# Patient Record
Sex: Female | Born: 1980 | Race: White | Hispanic: No | Marital: Married | Smoking: Never smoker
Health system: Southern US, Community
[De-identification: ages and names within clinical notes are randomized; demographics above are authoritative.]

## PROBLEM LIST (undated history)

## (undated) DIAGNOSIS — G459 Transient cerebral ischemic attack, unspecified: Secondary | ICD-10-CM

## (undated) DIAGNOSIS — K219 Gastro-esophageal reflux disease without esophagitis: Secondary | ICD-10-CM

## (undated) DIAGNOSIS — D473 Essential (hemorrhagic) thrombocythemia: Secondary | ICD-10-CM

## (undated) DIAGNOSIS — T7029XA Other effects of high altitude, initial encounter: Secondary | ICD-10-CM

## (undated) DIAGNOSIS — T7840XA Allergy, unspecified, initial encounter: Secondary | ICD-10-CM

## (undated) DIAGNOSIS — D45 Polycythemia vera: Secondary | ICD-10-CM

## (undated) DIAGNOSIS — T7020XA Unspecified effects of high altitude, initial encounter: Secondary | ICD-10-CM

## (undated) HISTORY — DX: Transient cerebral ischemic attack, unspecified: G45.9

## (undated) HISTORY — DX: Allergy, unspecified, initial encounter: T78.40XA

## (undated) HISTORY — DX: Essential (hemorrhagic) thrombocythemia: D47.3

## (undated) HISTORY — DX: Gastro-esophageal reflux disease without esophagitis: K21.9

## (undated) HISTORY — PX: NASAL SINUS SURGERY: SHX719

---

## 2004-10-23 ENCOUNTER — Emergency Department (HOSPITAL_COMMUNITY): Admission: EM | Admit: 2004-10-23 | Discharge: 2004-10-23 | Payer: Self-pay | Admitting: Emergency Medicine

## 2013-09-01 ENCOUNTER — Ambulatory Visit (INDEPENDENT_AMBULATORY_CARE_PROVIDER_SITE_OTHER): Payer: BC Managed Care – PPO

## 2013-09-01 ENCOUNTER — Ambulatory Visit (INDEPENDENT_AMBULATORY_CARE_PROVIDER_SITE_OTHER): Payer: BC Managed Care – PPO | Admitting: Family Medicine

## 2013-09-01 VITALS — BP 123/82 | HR 56 | Temp 98.5°F | Resp 17 | Ht 66.0 in | Wt 150.0 lb

## 2013-09-01 DIAGNOSIS — R51 Headache: Secondary | ICD-10-CM

## 2013-09-01 DIAGNOSIS — T7029XA Other effects of high altitude, initial encounter: Secondary | ICD-10-CM

## 2013-09-01 DIAGNOSIS — R0789 Other chest pain: Secondary | ICD-10-CM

## 2013-09-01 DIAGNOSIS — R110 Nausea: Secondary | ICD-10-CM

## 2013-09-01 DIAGNOSIS — R0602 Shortness of breath: Secondary | ICD-10-CM

## 2013-09-01 DIAGNOSIS — T7020XA Unspecified effects of high altitude, initial encounter: Secondary | ICD-10-CM

## 2013-09-01 LAB — POCT URINALYSIS DIPSTICK
BILIRUBIN UA: NEGATIVE
GLUCOSE UA: NEGATIVE
Ketones, UA: NEGATIVE
Leukocytes, UA: NEGATIVE
Nitrite, UA: NEGATIVE
PROTEIN UA: NEGATIVE
SPEC GRAV UA: 1.02
UROBILINOGEN UA: 0.2
pH, UA: 7

## 2013-09-01 LAB — POCT UA - MICROSCOPIC ONLY
CASTS, UR, LPF, POC: NEGATIVE
CRYSTALS, UR, HPF, POC: NEGATIVE
YEAST UA: NEGATIVE

## 2013-09-01 LAB — D-DIMER, QUANTITATIVE: D-Dimer, Quant: 1.79 ug/mL-FEU — ABNORMAL HIGH (ref 0.00–0.48)

## 2013-09-01 NOTE — Patient Instructions (Signed)
Altitude Sickness °Altitude sickness occurs when a person goes to a high altitude (at least 8,200 ft [2,460 m] above sea level) without first letting the body adjust to the higher altitude (acclimate). Symptoms generally develop within 72 hours of arriving at high altitude. It can happen to anyone, regardless of physical condition. Altitude sickness is a medical emergency that can develop into a life-threatening condition.  °CAUSES  °Altitude sickness is caused by rapidly ascending to higher altitudes that expose you to lower air pressure and lower oxygen levels. Going to high altitudes quickly and exerting yourself can make altitude sickness more likely to occur.  °SYMPTOMS  °· Severe headache. °· Nausea and vomiting. °· Shortness of breath. °· Dizziness. °· Confusion. °· Uncoordinated movements. °· Fatigue and sleep disturbances. °· Weakness. °· Hallucinations. °DIAGNOSIS  °Your caregiver will take your medical history and perform a physical exam. A chest X-ray may also be taken. °TREATMENT  °In most cases of mild altitude sickness, your symptoms will resolve gradually over 3 to 5 days. If treatment is needed, it begins with moving to a lower altitude (1,800 ft [540 m] above sea level or lower) as quickly and safely as possible. Oxygen and certain medicines may also be given to help with breathing. In severe cases, you may need to stay in the hospital. °PREVENTION  °To prevent altitude sickness during future trips: °· Go to higher altitudes slowly, giving your body time to acclimate. °· Go to higher altitudes during the daytime and return to lower altitudes at night. °· Give your body a few days to adjust to a change in altitude before starting strenuous physical activities. °· Ask your caregiver about medicines you can take to prevent altitude sickness. °HOME CARE INSTRUCTIONS  °· If you must exercise, do so lightly for the first 24 to 36 hours after treatment. °· Drink enough fluids to keep your urine clear or  pale yellow. °· Eat small, light meals. °· Avoid smoking, calming medicines (sedatives), and alcohol. °· Remain at a low altitude. °· Have someone stay with you until you feel stable. °· Keep all follow-up appointments as directed by your caregiver. °SEEK IMMEDIATE MEDICAL CARE IF: °· You have severe shortness of breath at rest or with exertion. °· You have chest pain or tightness. °· You have a fast heartbeat. °· You have a severe headache. °· You have a severe cough. °· You have difficulty walking. °· You have difficulty concentrating. °· You feel confused. °MAKE SURE YOU:  °· Understand these instructions. °· Will watch your condition. °· Will get help right away if you are not doing well or get worse. °Document Released: 02/17/2000 Document Revised: 08/21/2011 Document Reviewed: 04/20/2011 °ExitCare® Patient Information ©2015 [employer]. This information is not intended to replace advice given to you by your health care provider. Make sure you discuss any questions you have with your health care provider. ° °

## 2013-09-01 NOTE — Progress Notes (Addendum)
Subjective:    Patient ID: Shannon Mcdonald, female    DOB: February 12, 1981, 33 y.o.   MRN: [id number] This chart was scribed for Reginia Forts, MD by Vernell Barrier, Medical Scribe. The patient was seen in room 5. This patient's care was started at 6:08 PM.   09/01/2013  Chest Pain and Shortness of Breath   Chest Pain  Associated symptoms include abdominal pain, a cough, headaches and shortness of breath. Pertinent negatives include no diaphoresis, dizziness, fever, nausea, numbness or vomiting.  Shortness of Breath Associated symptoms include abdominal pain, chest pain, headaches, rhinorrhea and a sore throat. Pertinent negatives include no ear pain, fever or vomiting.   HPI Comments: Shannon Mcdonald is a 33 y.o. female who presents to the [facility] complaining of SOB, intermittent chest pressure, and dull HA. States she went on a 4 day back packing trip in the mountains in [place], [place] and returned yesterday. Flew 5 hrs to [place], drove 4 hours to [facility], 4 hours back to [place], and flew 5 hours back to [place]. States sxs began on the drive back to [place]. Reports HA and central to left sided chest tightness at that time. Some nausea and diaphoresis primarily at night. 1 episode of diarrhea 4 days ago. States she was between 7,000-10,000 ft at highest elevation during trip. Has not been at a peak that high in 7-8 years. Went to the ED 4 days ago in [place] and had a CXR and EKG completed that came back normal. CBC and CMET done that were normal. Was given a GI cocktail she states improved sxs temporarily. Reports she was drinking a lot of water to stay hydrated. Still reports SOB, intermittent chest pressure, and dull HA. Also reports some constant tightness in her throat; more pronounced with breathing. Some mild productive cough and mild rhinorrhea. Legs feel achy but mostly attributes this to the hiking. Decreased appetite since returning. States these  sxs have not occurred with hiking at high altitudes in the past.    Does not take any regular medications. No PCP. No family hx of chronic illness except mother has HTN. Does not smoke. Alcohol occasionally. Exercises regularly.   Otherwise healthy. Denies fever, chills, nausea, diaphoresis, visual disturbance, dizziness, or tinnitus.   Review of Systems  Constitutional: Positive for appetite change and fatigue. Negative for fever, chills and diaphoresis.  HENT: Positive for rhinorrhea and sore throat. Negative for congestion, ear pain, postnasal drip and tinnitus.   Eyes: Negative for photophobia and visual disturbance.  Respiratory: Positive for cough, chest tightness and shortness of breath.   Cardiovascular: Positive for chest pain.  Gastrointestinal: Positive for abdominal pain. Negative for nausea, vomiting and diarrhea.  Musculoskeletal: Positive for myalgias.  Neurological: Positive for headaches. Negative for dizziness, tremors, facial asymmetry, speech difficulty, light-headedness and numbness.  Psychiatric/Behavioral: Negative for confusion and decreased concentration.    Past Medical History  Diagnosis Date  . Allergy   . GERD (gastroesophageal reflux disease)    Past Surgical History  Procedure Laterality Date  . Nasal sinus surgery      No Known Allergies Current Outpatient Prescriptions  Medication Sig Dispense Refill  . fluticasone (FLONASE) 50 MCG/ACT nasal spray Place into both nostrils daily.      Marland Kitchen omeprazole (PRILOSEC) 10 MG capsule Take 10 mg by mouth daily.       No current facility-administered medications for this visit.   History   Social History  . Marital Status: Single  Spouse Name: N/A    Number of Children: N/A  . Years of Education: N/A   Occupational History  . Not on file.   Social History Main Topics  . Smoking status: Never Smoker   . Smokeless tobacco: Not on file  . Alcohol Use: No  . Drug Use: No  . Sexual Activity: No    Other Topics Concern  . Not on file   Social History Narrative  . No narrative on file   Family History  Problem Relation Age of Onset  . Hypertension Mother     Objective:  Triage vitals: BP 123/82  Pulse 56  Temp(Src) 98.5 F (36.9 C) (Oral)  Resp 17  Ht $R'5\' 6"'Ez$  (1.676 m)  Wt 150 lb (68.04 kg)  BMI 24.22 kg/m2  SpO2 100%  LMP 08/25/2013  Physical Exam  Nursing note and vitals reviewed. Constitutional: She is oriented to person, place, and time. She appears well-developed and well-nourished. No distress.  HENT:  Head: Normocephalic and atraumatic.  Right Ear: External ear normal.  Left Ear: External ear normal.  Nose: Nose normal.  Mouth/Throat: Oropharynx is clear and moist.  Eyes: Conjunctivae and EOM are normal. Pupils are equal, round, and reactive to light.  Neck: Normal range of motion. Neck supple. Carotid bruit is not present. No tracheal deviation present. No thyromegaly present.  Cardiovascular: Normal rate, regular rhythm, normal heart sounds and intact distal pulses.  Exam reveals no gallop and no friction rub.   No murmur heard. Pulses:      Dorsalis pedis pulses are 2+ on the right side, and 2+ on the left side.  Capillary refill > 3 seconds.  Pulmonary/Chest: Effort normal and breath sounds normal. No respiratory distress. She has no wheezes. She has no rales.  Abdominal: Soft. Bowel sounds are normal. She exhibits no distension and no mass. There is tenderness in the epigastric area and left upper quadrant. There is no rebound and no guarding.  Epigastric and LUQ  Musculoskeletal: Normal range of motion.  Lymphadenopathy:    She has no cervical adenopathy.  Neurological: She is alert and oriented to person, place, and time. No cranial nerve deficit.  Skin: Skin is warm and dry. No rash noted. She is not diaphoretic. No erythema. No pallor.  Psychiatric: She has a normal mood and affect. Her behavior is normal.   Results for orders placed in visit on  09/01/13  COMPREHENSIVE METABOLIC PANEL      Result Value Ref Range   Sodium 143  135 - 145 mEq/L   Potassium 4.5  3.5 - 5.3 mEq/L   Chloride 109  96 - 112 mEq/L   CO2 27  19 - 32 mEq/L   Glucose, Bld 81  70 - 99 mg/dL   BUN 15  6 - 23 mg/dL   Creat 0.90  0.50 - 1.10 mg/dL   Total Bilirubin 0.2  0.2 - 1.2 mg/dL   Alkaline Phosphatase 34 (*) 39 - 117 U/L   AST 17  0 - 37 U/L   ALT 20  0 - 35 U/L   Total Protein 6.3  6.0 - 8.3 g/dL   Albumin 4.2  3.5 - 5.2 g/dL   Calcium 9.3  8.4 - 10.5 mg/dL  D-DIMER, QUANTITATIVE      Result Value Ref Range   D-Dimer, Quant 1.79 (*) 0.00 - 0.48 ug/mL-FEU  CBC WITH DIFFERENTIAL      Result Value Ref Range   WBC 6.2  4.0 - 10.5  K/uL   RBC 4.13  3.87 - 5.11 MIL/uL   Hemoglobin 12.9  12.0 - 15.0 g/dL   HCT 37.7  36.0 - 46.0 %   MCV 91.3  78.0 - 100.0 fL   MCH 31.2  26.0 - 34.0 pg   MCHC 34.2  30.0 - 36.0 g/dL   RDW 13.4  11.5 - 15.5 %   Platelets 520 (*) 150 - 400 K/uL   Neutrophils Relative % 62  43 - 77 %   Neutro Abs 3.8  1.7 - 7.7 K/uL   Lymphocytes Relative 30  12 - 46 %   Lymphs Abs 1.9  0.7 - 4.0 K/uL   Monocytes Relative 5  3 - 12 %   Monocytes Absolute 0.3  0.1 - 1.0 K/uL   Eosinophils Relative 2  0 - 5 %   Eosinophils Absolute 0.1  0.0 - 0.7 K/uL   Basophils Relative 1  0 - 1 %   Basophils Absolute 0.1  0.0 - 0.1 K/uL   Smear Review Criteria for review not met    POCT UA - MICROSCOPIC ONLY      Result Value Ref Range   WBC, Ur, HPF, POC 4-6     RBC, urine, microscopic 0-2     Bacteria, U Microscopic 2+     Mucus, UA 1+     Epithelial cells, urine per micros 3-6     Crystals, Ur, HPF, POC neg     Casts, Ur, LPF, POC neg     Yeast, UA neg    POCT URINALYSIS DIPSTICK      Result Value Ref Range   Color, UA yellow     Clarity, UA hazy     Glucose, UA neg     Bilirubin, UA neg     Ketones, UA neg     Spec Grav, UA 1.020     Blood, UA small     pH, UA 7.0     Protein, UA neg     Urobilinogen, UA 0.2     Nitrite, UA neg      Leukocytes, UA Negative        [facility] reading (PRIMARY) by  Dr. Tamala Julian.  CXR:  NAD   Assessment & Plan:  Shortness of breath - Plan: POCT UA - Microscopic Only, POCT urinalysis dipstick, Comprehensive metabolic panel, D-dimer, quantitative, DG Chest 2 View, CBC with Differential, CT Angio Chest W/Cm &/Or Wo Cm, CANCELED: POCT CBC  Other chest pain - Plan: POCT UA - Microscopic Only, POCT urinalysis dipstick, Comprehensive metabolic panel, D-dimer, quantitative, DG Chest 2 View, CBC with Differential, CT Angio Chest W/Cm &/Or Wo Cm, CANCELED: POCT CBC  Altitude sickness, initial encounter  Nausea alone  Headache(784.0)   1.  Shortness of breath: New.  Associated with headache, chest tightness, nausea. Consistent with altitude sickness; however, recent prolonged travel thus will obtain D-Dimer which is positive; refer for CT chest angio to rule out PE.  Benign exam in office; no respiratory distress.   2.  Chest pain: New. Associated with SOB.  EKG in [place] negative; obtain CBC, CMET which are both normal.  No suggestion of cardiac etiology. 3.  Nausea: New. Recommend BRAT diet, hydration.  Recommend taking Prilosec daily for next two weeks. 4. HA: New. Normal neurological exam in office. Recommend Tylenol or Ibuprofen PRN. 5.  Altitude Sickness: New.  Neurological intact; normal CXR and lung exam.  CXR without pulmonary edema.  Supportive care with rest, Tylenol, hydration.  Has returned to normal altitude so symptoms should slowly improve.  Meds ordered this encounter  Medications  . fluticasone (FLONASE) 50 MCG/ACT nasal spray    Sig: Place into both nostrils daily.  Marland Kitchen omeprazole (PRILOSEC) 10 MG capsule    Sig: Take 10 mg by mouth daily.    No Follow-up on file.   I personally performed the services described in this documentation, which was scribed in my presence.  The recorded information has been reviewed and is accurate.  Reginia Forts, M.D.  [facility] [address] [place], [place]  [id number] [phone] phone [phone] fax

## 2013-09-02 LAB — COMPREHENSIVE METABOLIC PANEL
ALBUMIN: 4.2 g/dL (ref 3.5–5.2)
ALK PHOS: 34 U/L — AB (ref 39–117)
ALT: 20 U/L (ref 0–35)
AST: 17 U/L (ref 0–37)
BILIRUBIN TOTAL: 0.2 mg/dL (ref 0.2–1.2)
BUN: 15 mg/dL (ref 6–23)
CHLORIDE: 109 meq/L (ref 96–112)
CO2: 27 mEq/L (ref 19–32)
Calcium: 9.3 mg/dL (ref 8.4–10.5)
Creat: 0.9 mg/dL (ref 0.50–1.10)
Glucose, Bld: 81 mg/dL (ref 70–99)
Potassium: 4.5 mEq/L (ref 3.5–5.3)
SODIUM: 143 meq/L (ref 135–145)
Total Protein: 6.3 g/dL (ref 6.0–8.3)

## 2013-09-02 LAB — CBC WITH DIFFERENTIAL/PLATELET
BASOS ABS: 0.1 10*3/uL (ref 0.0–0.1)
Basophils Relative: 1 % (ref 0–1)
EOS ABS: 0.1 10*3/uL (ref 0.0–0.7)
EOS PCT: 2 % (ref 0–5)
HEMATOCRIT: 37.7 % (ref 36.0–46.0)
HEMOGLOBIN: 12.9 g/dL (ref 12.0–15.0)
LYMPHS ABS: 1.9 10*3/uL (ref 0.7–4.0)
Lymphocytes Relative: 30 % (ref 12–46)
MCH: 31.2 pg (ref 26.0–34.0)
MCHC: 34.2 g/dL (ref 30.0–36.0)
MCV: 91.3 fL (ref 78.0–100.0)
MONO ABS: 0.3 10*3/uL (ref 0.1–1.0)
MONOS PCT: 5 % (ref 3–12)
NEUTROS PCT: 62 % (ref 43–77)
Neutro Abs: 3.8 10*3/uL (ref 1.7–7.7)
Platelets: 520 10*3/uL — ABNORMAL HIGH (ref 150–400)
RBC: 4.13 MIL/uL (ref 3.87–5.11)
RDW: 13.4 % (ref 11.5–15.5)
WBC: 6.2 10*3/uL (ref 4.0–10.5)

## 2013-09-03 ENCOUNTER — Ambulatory Visit
Admission: RE | Admit: 2013-09-03 | Discharge: 2013-09-03 | Disposition: A | Discharge status: Home / Self Care | Payer: BC Managed Care – PPO | Source: Ambulatory Visit | Attending: Family Medicine | Admitting: Family Medicine

## 2013-09-03 ENCOUNTER — Telehealth: Payer: Self-pay

## 2013-09-03 ENCOUNTER — Telehealth: Payer: Self-pay | Admitting: *Deleted

## 2013-09-03 ENCOUNTER — Telehealth: Payer: Self-pay | Admitting: Radiology

## 2013-09-03 DIAGNOSIS — R0602 Shortness of breath: Secondary | ICD-10-CM

## 2013-09-03 DIAGNOSIS — R0789 Other chest pain: Secondary | ICD-10-CM

## 2013-09-03 MED ORDER — IOHEXOL 350 MG/ML SOLN
125.0000 mL | Freq: Once | INTRAVENOUS | Status: AC | PRN
  Administered 2013-09-03: 125 mL via INTRAVENOUS

## 2013-09-03 NOTE — Telephone Encounter (Signed)
Set up appointment at [place] for CT chest angiogram at 11:30am today Left message on machine for patient with details.  Instructed her to call back to let us know she got my message with her appointment date and time.  (Call report to [phone]; Prior auth number [id number]. [facility] aware of prior auth)

## 2013-09-03 NOTE — Telephone Encounter (Signed)
Patient called regarding her results. States that Clarise Cruz mentioned she would call her back today but she has not heard anything yet.

## 2013-09-03 NOTE — Telephone Encounter (Signed)
Imaging results are in- trace bilateral effusions noted in scan. Please advise.

## 2013-09-03 NOTE — Telephone Encounter (Signed)
PATIENT IS RETURNING A MISSED PHONE CALL FROM TODAY. PATIENT ISN'T SURE WHAT PHONE CALL WAS FOR. PLEASE CALL PATIENT! [phone]

## 2013-09-03 NOTE — Telephone Encounter (Signed)
Dr Tamala Julian pt is concerned about some lingering symptoms. Pt reports that she has had a dull headache- takes tylenol but does not completely go away. She has been having leg pains that radiate down bilateral legs. She notices that the pain is worse when she is sitting down.

## 2013-09-03 NOTE — Telephone Encounter (Signed)
Call -- no evidence of blood clot in lungs. Lm for pt on VM with results to rtn call if any further questions.

## 2013-09-03 NOTE — Telephone Encounter (Signed)
Patient called back, received details of appointment at [facility] and will be able to go today at scheduled time

## 2013-09-03 NOTE — Telephone Encounter (Signed)
Spoke to patient --- no further work up at this time recommended.  Legs are aching and headache is not resolved; recommend supportive care with rest, fluids, Tylenol or Ibuprofen.  Should follow-up for worsening symptoms.  SOB has improved each day.

## 2013-09-04 NOTE — Telephone Encounter (Signed)
Pt was advised of CT results.

## 2013-09-10 ENCOUNTER — Ambulatory Visit (INDEPENDENT_AMBULATORY_CARE_PROVIDER_SITE_OTHER): Payer: BC Managed Care – PPO | Admitting: Family Medicine

## 2013-09-10 VITALS — BP 118/80 | HR 65 | Temp 97.8°F | Resp 16 | Ht 65.5 in | Wt 146.0 lb

## 2013-09-10 DIAGNOSIS — T7020XD Unspecified effects of high altitude, subsequent encounter: Secondary | ICD-10-CM

## 2013-09-10 DIAGNOSIS — R0789 Other chest pain: Secondary | ICD-10-CM

## 2013-09-10 DIAGNOSIS — K219 Gastro-esophageal reflux disease without esophagitis: Secondary | ICD-10-CM

## 2013-09-10 DIAGNOSIS — R202 Paresthesia of skin: Secondary | ICD-10-CM

## 2013-09-10 DIAGNOSIS — IMO0001 Reserved for inherently not codable concepts without codable children: Secondary | ICD-10-CM

## 2013-09-10 DIAGNOSIS — Z5189 Encounter for other specified aftercare: Secondary | ICD-10-CM

## 2013-09-10 DIAGNOSIS — M791 Myalgia, unspecified site: Secondary | ICD-10-CM

## 2013-09-10 DIAGNOSIS — D75839 Thrombocytosis, unspecified: Secondary | ICD-10-CM

## 2013-09-10 DIAGNOSIS — D473 Essential (hemorrhagic) thrombocythemia: Secondary | ICD-10-CM

## 2013-09-10 DIAGNOSIS — R209 Unspecified disturbances of skin sensation: Secondary | ICD-10-CM

## 2013-09-10 DIAGNOSIS — T7020XA Unspecified effects of high altitude, initial encounter: Secondary | ICD-10-CM

## 2013-09-10 DIAGNOSIS — T7029XD Other effects of high altitude, subsequent encounter: Secondary | ICD-10-CM

## 2013-09-10 NOTE — Progress Notes (Signed)
Subjective:    Patient ID: Shannon Mcdonald, female    DOB: 08-07-1980, 33 y.o.   MRN: [id number]  09/10/2013  Leg Pain   HPI This 33 y.o. female presents for one week follow-up and evaluation of the following:  1. Chest pain: interimttent for past day.  Consistent with heartburn/reflux.  Has been taking PPI daily since last visit. Drinks 4 cups of coffee per day.  Reflux is often triggered by exercise; has noticed this during exercise. No associated SOB, diaphoresis, fatigue, radiation with chest pain.  No major stressors.  SOB improved after last visit.    2.  Leg pain with tingling: worse with sitting.  Yesterday sat with legs up; now with legs flexed with onset of aching and tingling.  Forcing self to walk around.  Aching in calves B; +posterior calf and posterior knee.  Tingling is all over.  Drinking tons of water.  Eight glasses per day.  Started trying to walk 2 miles per day last week; started walking four miles per day this week.  Traveling tomorrow; traveling to [employer] and going to [facility].     3. Elevated platelet count: has been up for the past six months; no further evaluation for elevated platelets.  Just wants to make sure that it is OK for patient to travel with current symptoms.  4. Headaches: now resolved.    5.  Altitude sickness; gradually improved after last visit; no further suffering with headache, dizziness, SOB.     Review of Systems  Constitutional: Negative for fever, chills, diaphoresis and fatigue.  HENT: Negative for congestion, ear pain, sore throat and trouble swallowing.   Eyes: Negative for photophobia and visual disturbance.  Respiratory: Negative for cough, shortness of breath, wheezing and stridor.   Cardiovascular: Positive for chest pain. Negative for palpitations and leg swelling.  Gastrointestinal: Negative for nausea, vomiting, abdominal pain, diarrhea, constipation and abdominal distention.  Endocrine: Negative for cold intolerance, heat  intolerance, polydipsia, polyphagia and polyuria.  Genitourinary: Negative for dysuria and urgency.  Musculoskeletal: Positive for myalgias. Negative for arthralgias and back pain.  Skin: Negative for rash.  Neurological: Positive for numbness. Negative for dizziness, tremors, facial asymmetry, weakness, light-headedness and headaches.  Psychiatric/Behavioral: Negative for sleep disturbance. The patient is not nervous/anxious.     Past Medical History  Diagnosis Date  . Allergy   . GERD (gastroesophageal reflux disease)    Past Surgical History  Procedure Laterality Date  . Nasal sinus surgery      No Known Allergies Current Outpatient Prescriptions  Medication Sig Dispense Refill  . fluticasone (FLONASE) 50 MCG/ACT nasal spray Place into both nostrils daily.      Marland Kitchen omeprazole (PRILOSEC) 10 MG capsule Take 10 mg by mouth daily.       No current facility-administered medications for this visit.   History   Social History  . Marital Status: Single    Spouse Name: N/A    Number of Children: N/A  . Years of Education: N/A   Occupational History  . Not on file.   Social History Main Topics  . Smoking status: Never Smoker   . Smokeless tobacco: Not on file  . Alcohol Use: No  . Drug Use: No  . Sexual Activity: No   Other Topics Concern  . Not on file   Social History Narrative  . No narrative on file   Family History  Problem Relation Age of Onset  . Hypertension Mother  Objective:    BP 118/80  Pulse 65  Temp(Src) 97.8 F (36.6 C) (Oral)  Resp 16  Ht 5' 5.5" (1.664 m)  Wt 146 lb (66.225 kg)  BMI 23.92 kg/m2  SpO2 99%  LMP 08/25/2013 Physical Exam  Constitutional: She is oriented to person, place, and time. She appears well-developed and well-nourished. No distress.  HENT:  Head: Normocephalic and atraumatic.  Right Ear: External ear normal.  Left Ear: External ear normal.  Nose: Nose normal.  Mouth/Throat: Oropharynx is clear and moist.    Eyes: Conjunctivae and EOM are normal. Pupils are equal, round, and reactive to light.  Neck: Normal range of motion. Neck supple. Carotid bruit is not present. No thyromegaly present.  Cardiovascular: Normal rate, regular rhythm, normal heart sounds and intact distal pulses.  Exam reveals no gallop and no friction rub.   No murmur heard. Pulmonary/Chest: Effort normal and breath sounds normal. She has no wheezes. She has no rales. She exhibits no tenderness.  Abdominal: Soft. Bowel sounds are normal. She exhibits no distension and no mass. There is no tenderness. There is no rebound and no guarding.  Musculoskeletal:       Right shoulder: Normal.       Left shoulder: Normal.       Right knee: She exhibits normal range of motion, no swelling, no effusion and no bony tenderness. No tenderness found. No medial joint line and no lateral joint line tenderness noted.       Left knee: She exhibits normal range of motion, no swelling, no effusion and no bony tenderness. No tenderness found. No medial joint line and no lateral joint line tenderness noted.       Right ankle: Normal.       Left ankle: Normal.       Cervical back: Normal. She exhibits normal range of motion, no tenderness, no bony tenderness, no pain and no spasm.       Thoracic back: She exhibits normal range of motion, no tenderness and no bony tenderness.       Lumbar back: She exhibits normal range of motion, no tenderness, no bony tenderness, no pain and no spasm.       Right upper leg: She exhibits no tenderness, no bony tenderness, no swelling and no edema.       Left upper leg: Normal. She exhibits no tenderness, no bony tenderness and no swelling.       Right lower leg: She exhibits no tenderness, no bony tenderness, no swelling and no edema.       Left lower leg: She exhibits no tenderness, no bony tenderness, no swelling and no edema.  Lymphadenopathy:    She has no cervical adenopathy.  Neurological: She is alert and  oriented to person, place, and time. No cranial nerve deficit.  Skin: Skin is warm and dry. No rash noted. She is not diaphoretic. No erythema. No pallor.  Psychiatric: She has a normal mood and affect. Her behavior is normal.   Results for orders placed in visit on 09/10/13  CK      Result Value Ref Range   Total CK 45  7 - 177 U/L  CBC      Result Value Ref Range   WBC 5.1  4.0 - 10.5 K/uL   RBC 4.76  3.87 - 5.11 MIL/uL   Hemoglobin 14.9  12.0 - 15.0 g/dL   HCT 42.9  36.0 - 46.0 %   MCV 90.1  78.0 - 100.0  fL   MCH 31.3  26.0 - 34.0 pg   MCHC 34.7  30.0 - 36.0 g/dL   RDW 13.3  11.5 - 15.5 %   Platelets 556 (*) 150 - 400 K/uL  COMPLETE METABOLIC PANEL WITH GFR      Result Value Ref Range   Sodium 140  135 - 145 mEq/L   Potassium 4.4  3.5 - 5.3 mEq/L   Chloride 105  96 - 112 mEq/L   CO2 29  19 - 32 mEq/L   Glucose, Bld 78  70 - 99 mg/dL   BUN 15  6 - 23 mg/dL   Creat 0.98  0.50 - 1.10 mg/dL   Total Bilirubin 0.7  0.2 - 1.2 mg/dL   Alkaline Phosphatase 41  39 - 117 U/L   AST 14  0 - 37 U/L   ALT 16  0 - 35 U/L   Total Protein 7.3  6.0 - 8.3 g/dL   Albumin 4.7  3.5 - 5.2 g/dL   Calcium 9.9  8.4 - 10.5 mg/dL   GFR, Est African American 88     GFR, Est Non African American 77    TSH      Result Value Ref Range   TSH 1.638  0.350 - 4.500 uIU/mL  VITAMIN B12      Result Value Ref Range   Vitamin B-12 332  211 - 911 pg/mL  IRON      Result Value Ref Range   Iron 121  42 - 145 ug/dL  FOLATE      Result Value Ref Range   Folate 11.2         Assessment & Plan:  Paresthesia - Plan: CBC, COMPLETE METABOLIC PANEL WITH GFR, TSH, Vitamin B12, Iron, Folate  Myalgia - Plan: CBC, COMPLETE METABOLIC PANEL WITH GFR, TSH, Vitamin B12, Iron, Folate  Other chest pain - Plan: CK  Gastroesophageal reflux disease without esophagitis - Plan: CBC, COMPLETE METABOLIC PANEL WITH GFR  Thrombocytosis  Altitude sickness, subsequent encounter  1.  Paresthesias:  New.  B legs; benign  exam and no associated weakness or back pain.  Benign exam. Normal TSH, B12, iron, and folate levels.  Continue to monitor. RTC immediately for associated weakness, lower back pain, or worsening. If worsens or persists, refer to neurology for NCS and EMG. 2.  Myalgias: New.  Associated with paresthesias of BLE.  Benign exam. Normal CK.  Monitor; recommend ongoing stretching and hydration.  If persists or worsens, RTC. 3.  GERD: worsening; dietary modification recommended and reviewed; continue with daily PPI with upcoming travel. 4.  Chest pain:  New; associated with worsening GERD symptoms; continue with daily PPI.   5.  Thrombocytosis: persistent; recommend repeat in 1-3 months. If persists, may warrant hematology consultation. 6.  Altitude sickness: improving.  No orders of the defined types were placed in this encounter.    No Follow-up on file.    Reginia Forts, M.D.  [facility] [address] [place], [place]  [address] [phone] phone [phone] fax

## 2013-09-11 LAB — COMPLETE METABOLIC PANEL WITH GFR
ALBUMIN: 4.7 g/dL (ref 3.5–5.2)
ALT: 16 U/L (ref 0–35)
AST: 14 U/L (ref 0–37)
Alkaline Phosphatase: 41 U/L (ref 39–117)
BUN: 15 mg/dL (ref 6–23)
CHLORIDE: 105 meq/L (ref 96–112)
CO2: 29 meq/L (ref 19–32)
Calcium: 9.9 mg/dL (ref 8.4–10.5)
Creat: 0.98 mg/dL (ref 0.50–1.10)
GFR, EST AFRICAN AMERICAN: 88 mL/min
GFR, EST NON AFRICAN AMERICAN: 77 mL/min
GLUCOSE: 78 mg/dL (ref 70–99)
Potassium: 4.4 mEq/L (ref 3.5–5.3)
Sodium: 140 mEq/L (ref 135–145)
TOTAL PROTEIN: 7.3 g/dL (ref 6.0–8.3)
Total Bilirubin: 0.7 mg/dL (ref 0.2–1.2)

## 2013-09-11 LAB — CBC
HCT: 42.9 % (ref 36.0–46.0)
Hemoglobin: 14.9 g/dL (ref 12.0–15.0)
MCH: 31.3 pg (ref 26.0–34.0)
MCHC: 34.7 g/dL (ref 30.0–36.0)
MCV: 90.1 fL (ref 78.0–100.0)
PLATELETS: 556 10*3/uL — AB (ref 150–400)
RBC: 4.76 MIL/uL (ref 3.87–5.11)
RDW: 13.3 % (ref 11.5–15.5)
WBC: 5.1 10*3/uL (ref 4.0–10.5)

## 2013-09-11 LAB — VITAMIN B12: Vitamin B-12: 332 pg/mL (ref 211–911)

## 2013-09-11 LAB — IRON: Iron: 121 ug/dL (ref 42–145)

## 2013-09-11 LAB — FOLATE: FOLATE: 11.2 ng/mL

## 2013-09-11 LAB — TSH: TSH: 1.638 u[IU]/mL (ref 0.350–4.500)

## 2013-09-11 LAB — CK: CK TOTAL: 45 U/L (ref 7–177)

## 2013-09-16 ENCOUNTER — Encounter: Payer: Self-pay | Admitting: Family Medicine

## 2013-09-16 DIAGNOSIS — D473 Essential (hemorrhagic) thrombocythemia: Secondary | ICD-10-CM

## 2013-09-16 DIAGNOSIS — D75839 Thrombocytosis, unspecified: Secondary | ICD-10-CM

## 2013-09-27 ENCOUNTER — Emergency Department (HOSPITAL_COMMUNITY)
Admission: EM | Admit: 2013-09-27 | Discharge: 2013-09-28 | Disposition: A | Discharge status: Home / Self Care | Payer: BC Managed Care – PPO | Attending: Emergency Medicine | Admitting: Emergency Medicine

## 2013-09-27 ENCOUNTER — Encounter (HOSPITAL_COMMUNITY): Payer: Self-pay | Admitting: Emergency Medicine

## 2013-09-27 DIAGNOSIS — R200 Anesthesia of skin: Secondary | ICD-10-CM

## 2013-09-27 DIAGNOSIS — Z3202 Encounter for pregnancy test, result negative: Secondary | ICD-10-CM | POA: Insufficient documentation

## 2013-09-27 DIAGNOSIS — Z7982 Long term (current) use of aspirin: Secondary | ICD-10-CM | POA: Insufficient documentation

## 2013-09-27 DIAGNOSIS — R202 Paresthesia of skin: Secondary | ICD-10-CM

## 2013-09-27 DIAGNOSIS — K219 Gastro-esophageal reflux disease without esophagitis: Secondary | ICD-10-CM | POA: Insufficient documentation

## 2013-09-27 DIAGNOSIS — R209 Unspecified disturbances of skin sensation: Secondary | ICD-10-CM | POA: Insufficient documentation

## 2013-09-27 DIAGNOSIS — R51 Headache: Secondary | ICD-10-CM | POA: Insufficient documentation

## 2013-09-27 DIAGNOSIS — R002 Palpitations: Secondary | ICD-10-CM | POA: Insufficient documentation

## 2013-09-27 HISTORY — DX: Other effects of high altitude, initial encounter: T70.29XA

## 2013-09-27 HISTORY — DX: Unspecified effects of high altitude, initial encounter: T70.20XA

## 2013-09-27 NOTE — ED Provider Notes (Signed)
CSN: [id number]     Arrival date & time 09/27/13  2109 History   First MD Initiated Contact with Patient 09/27/13 2313     Chief Complaint  Patient presents with  . Numbness     (Consider location/radiation/quality/duration/timing/severity/associated sxs/prior Treatment) HPI Patient has had greater than one day of a right-sided frontal headache. It was gradual onset. She states that this evening at about 8:00 she began having palpitations and feeling tremulous. She complains of numbness to her right bicep area. She had a relative die recently the last couple days. She denies any lower extremity swelling or pain. Patient states that the numbness to her right arm is now improved. She continues to have a mild right frontal headache. She had no vision changes. Denies any chest pain at any point. She was recently seen in [place] for similar event associated with chest pain. She states she had a CT of the chest rule out PE which was normal. Past Medical History  Diagnosis Date  . Allergy   . GERD (gastroesophageal reflux disease)   . Altitude sickness    Past Surgical History  Procedure Laterality Date  . Nasal sinus surgery     Family History  Problem Relation Age of Onset  . Hypertension Mother    History  Substance Use Topics  . Smoking status: Never Smoker   . Smokeless tobacco: Not on file  . Alcohol Use: No   OB History   Grav Para Term Preterm Abortions TAB SAB Ect Mult Living                 Review of Systems  Constitutional: Negative for fever and chills.  Eyes: Negative for visual disturbance.  Respiratory: Negative for shortness of breath.   Cardiovascular: Positive for palpitations. Negative for chest pain and leg swelling.  Gastrointestinal: Negative for nausea, vomiting and abdominal pain.  Musculoskeletal: Negative for back pain, myalgias, neck pain and neck stiffness.  Skin: Negative for rash and wound.  Neurological: Positive for numbness and headaches.  Negative for dizziness, syncope, weakness and light-headedness.  All other systems reviewed and are negative.     Allergies  Review of patient's allergies indicates no known allergies.  Home Medications   Prior to Admission medications   Medication Sig Start Date End Date Taking? Authorizing Provider  aspirin EC 81 MG tablet Take 81 mg by mouth daily.   Yes Historical Provider, MD  fluticasone (FLONASE) 50 MCG/ACT nasal spray Place 1 spray into both nostrils daily as needed for allergies.    Yes Historical Provider, MD  omeprazole (PRILOSEC) 10 MG capsule Take 10 mg by mouth daily as needed. For acid reflux   Yes Historical Provider, MD   BP 130/84  Pulse 93  Temp(Src) 98.7 F (37.1 C) (Oral)  Resp 18  Ht 5\' 5"  (1.651 m)  Wt 142 lb (64.411 kg)  BMI 23.63 kg/m2  SpO2 99%  LMP 09/26/2013 Physical Exam  Nursing note and vitals reviewed. Constitutional: She is oriented to person, place, and time. She appears well-developed and well-nourished. No distress.  HENT:  Head: Normocephalic and atraumatic.  Mouth/Throat: Oropharynx is clear and moist.  No sinus tenderness to percussion.  Eyes: EOM are normal. Pupils are equal, round, and reactive to light.  Neck: Normal range of motion. Neck supple.  No meningismus  Cardiovascular: Normal rate and regular rhythm.   Pulmonary/Chest: Effort normal and breath sounds normal. No respiratory distress. She has no wheezes. She has no rales. She exhibits no  tenderness.  Abdominal: Soft. Bowel sounds are normal. She exhibits no distension and no mass. There is no tenderness. There is no rebound and no guarding.  Musculoskeletal: Normal range of motion. She exhibits no edema and no tenderness.  No calf swelling or tenderness.  Neurological: She is alert and oriented to person, place, and time.  Patient is alert and oriented x3 with clear, goal oriented speech. Patient has 5/5 motor in all extremities. Sensation is intact to light touch. Bilateral  finger-to-nose is normal with no signs of dysmetria. Patient has a normal gait and walks without assistance.   Skin: Skin is warm and dry. No rash noted. No erythema.  Psychiatric: She has a normal mood and affect. Her behavior is normal.    ED Course  Procedures (including critical care time) Labs Review Labs Reviewed  CBC WITH DIFFERENTIAL  COMPREHENSIVE METABOLIC PANEL  URINALYSIS, ROUTINE W REFLEX MICROSCOPIC  PREGNANCY, URINE    Imaging Review No results found.   EKG Interpretation   Date/Time:  Sunday September 27 2013 21:23:47 EDT Ventricular Rate:  98 PR Interval:  135 QRS Duration: 102 QT Interval:  346 QTC Calculation: 442 R Axis:   57 Text Interpretation:  Sinus rhythm Consider right atrial enlargement  Confirmed by Lita Mains  MD, Deeana Atwater ([id number]) on 09/28/2013 3:19:19 AM      MDM   Final diagnoses:  None    Patient remains neurologically stable in emergency department. Workup was negative. Will discharge home to followup with her primary doctor. Low suspicion for TIA/CVA. She's been advised to return immediately to the emergency department for any concerns.    Julianne Rice, MD 09/28/13 [phone]

## 2013-09-27 NOTE — ED Notes (Signed)
Per pt report: pt was a dinner with a headache and during dinner, pt reports a shooting numbness feeling down the right side of her body.  Pt also reports she felt like her heart was racing.  Pt reports her right arm numbness has improved but has not completely gone away.  Pt still has a headache but the heart racing sensation seems to have improved.  Pt a/o x 4. Pt ambulatory in triage. NAD noted. Skin warm and dry.

## 2013-09-28 ENCOUNTER — Emergency Department (HOSPITAL_COMMUNITY): Payer: BC Managed Care – PPO

## 2013-09-28 LAB — URINE MICROSCOPIC-ADD ON

## 2013-09-28 LAB — PREGNANCY, URINE: Preg Test, Ur: NEGATIVE

## 2013-09-28 LAB — COMPREHENSIVE METABOLIC PANEL
ALBUMIN: 4.3 g/dL (ref 3.5–5.2)
ALT: 10 U/L (ref 0–35)
AST: 16 U/L (ref 0–37)
Alkaline Phosphatase: 42 U/L (ref 39–117)
Anion gap: 13 (ref 5–15)
BILIRUBIN TOTAL: 0.4 mg/dL (ref 0.3–1.2)
BUN: 11 mg/dL (ref 6–23)
CALCIUM: 9.1 mg/dL (ref 8.4–10.5)
CO2: 27 mEq/L (ref 19–32)
CREATININE: 0.9 mg/dL (ref 0.50–1.10)
Chloride: 103 mEq/L (ref 96–112)
GFR, EST NON AFRICAN AMERICAN: 84 mL/min — AB (ref >90)
GLUCOSE: 92 mg/dL (ref 70–99)
Potassium: 4.1 mEq/L (ref 3.7–5.3)
SODIUM: 143 meq/L (ref 137–147)
TOTAL PROTEIN: 7.2 g/dL (ref 6.0–8.3)

## 2013-09-28 LAB — URINALYSIS, ROUTINE W REFLEX MICROSCOPIC
BILIRUBIN URINE: NEGATIVE
Glucose, UA: NEGATIVE mg/dL
KETONES UR: NEGATIVE mg/dL
Leukocytes, UA: NEGATIVE
NITRITE: NEGATIVE
Protein, ur: NEGATIVE mg/dL
SPECIFIC GRAVITY, URINE: 1.009 (ref 1.005–1.030)
Urobilinogen, UA: 0.2 mg/dL (ref 0.0–1.0)
pH: 6.5 (ref 5.0–8.0)

## 2013-09-28 LAB — CBC WITH DIFFERENTIAL/PLATELET
BASOS ABS: 0 10*3/uL (ref 0.0–0.1)
BASOS PCT: 0 % (ref 0–1)
Eosinophils Absolute: 0.1 10*3/uL (ref 0.0–0.7)
Eosinophils Relative: 1 % (ref 0–5)
HEMATOCRIT: 40.6 % (ref 36.0–46.0)
Hemoglobin: 13.8 g/dL (ref 12.0–15.0)
Lymphocytes Relative: 26 % (ref 12–46)
Lymphs Abs: 2.1 10*3/uL (ref 0.7–4.0)
MCH: 31.7 pg (ref 26.0–34.0)
MCHC: 34 g/dL (ref 30.0–36.0)
MCV: 93.3 fL (ref 78.0–100.0)
MONO ABS: 0.4 10*3/uL (ref 0.1–1.0)
MONOS PCT: 5 % (ref 3–12)
Neutro Abs: 5.4 10*3/uL (ref 1.7–7.7)
Neutrophils Relative %: 68 % (ref 43–77)
PLATELETS: 530 10*3/uL — AB (ref 150–400)
RBC: 4.35 MIL/uL (ref 3.87–5.11)
RDW: 12.5 % (ref 11.5–15.5)
WBC: 8 10*3/uL (ref 4.0–10.5)

## 2013-09-28 NOTE — Discharge Instructions (Signed)
Paresthesia Paresthesia is an abnormal burning or prickling sensation. This sensation is generally felt in the hands, arms, legs, or feet. However, it may occur in any part of the body. It is usually not painful. The feeling may be described as:  Tingling or numbness.  "Pins and needles."  Skin crawling.  Buzzing.  Limbs "falling asleep."  Itching. Most people experience temporary (transient) paresthesia at some time in their lives. CAUSES  Paresthesia may occur when you breathe too quickly (hyperventilation). It can also occur without any apparent cause. Commonly, paresthesia occurs when pressure is placed on a nerve. The feeling quickly goes away once the pressure is removed. For some people, however, paresthesia is a long-lasting (chronic) condition caused by an underlying disorder. The underlying disorder may be:  A traumatic, direct injury to nerves. Examples include a:  Broken (fractured) neck.  Fractured skull.  A disorder affecting the brain and spinal cord (central nervous system). Examples include:  Transverse myelitis.  Encephalitis.  Transient ischemic attack.  Multiple sclerosis.  Stroke.  Tumor or blood vessel problems, such as an arteriovenous malformation pressing against the brain or spinal cord.  A condition that damages the peripheral nerves (peripheral neuropathy). Peripheral nerves are not part of the brain and spinal cord. These conditions include:  Diabetes.  Peripheral vascular disease.  Nerve entrapment syndromes, such as carpal tunnel syndrome.  Shingles.  Hypothyroidism.  Vitamin B12 deficiencies.  Alcoholism.  Heavy metal poisoning (lead, arsenic).  Rheumatoid arthritis.  Systemic lupus erythematosus. DIAGNOSIS  Your caregiver will attempt to find the underlying cause of your paresthesia. Your caregiver may:  Take your medical history.  Perform a physical exam.  Order various lab tests.  Order imaging tests. TREATMENT    Treatment for paresthesia depends on the underlying cause. HOME CARE INSTRUCTIONS  Avoid drinking alcohol.  You may consider massage or acupuncture to help relieve your symptoms.  Keep all follow-up appointments as directed by your caregiver. SEEK IMMEDIATE MEDICAL CARE IF:   You feel weak.  You have trouble walking or moving.  You have problems with speech or vision.  You feel confused.  You cannot control your bladder or bowel movements.  You feel numbness after an injury.  You faint.  Your burning or prickling feeling gets worse when walking.  You have pain, cramps, or dizziness.  You develop a rash. MAKE SURE YOU:  Understand these instructions.  Will watch your condition.  Will get help right away if you are not doing well or get worse. Document Released: 02/09/2002 Document Revised: 05/14/2011 Document Reviewed: 11/10/2010 [facility] Patient Information 2015 [place], [place]. This information is not intended to replace advice given to you by your health care provider. Make sure you discuss any questions you have with your health care provider.  Palpitations A palpitation is the feeling that your heartbeat is irregular. It may feel like your heart is fluttering or skipping a beat. It may also feel like your heart is beating faster than normal. This is usually not a serious problem. In some cases, you may need more medical tests. HOME CARE  Avoid:  Caffeine in coffee, tea, soft drinks, diet pills, and energy drinks.  Chocolate.  Alcohol.  Stop smoking if you smoke.  Reduce your stress and anxiety. Try:  A method that measures bodily functions so you can learn to control them (biofeedback).  Yoga.  Meditation.  Physical activity such as swimming, jogging, or walking.  Get plenty of rest and sleep. GET HELP IF:  Your  fast or irregular heartbeat continues after 24 hours.  Your palpitations occur more often. GET HELP RIGHT AWAY IF:   You have  chest pain.  You feel short of breath.  You have a very bad headache.  You feel dizzy or pass out (faint). MAKE SURE YOU:   Understand these instructions.  Will watch your condition.  Will get help right away if you are not doing well or get worse. Document Released: 11/29/2007 Document Revised: 07/06/2013 Document Reviewed: 04/20/2011 [facility] Patient Information 2015 [place], [place]. This information is not intended to replace advice given to you by your health care provider. Make sure you discuss any questions you have with your health care provider.

## 2013-09-29 ENCOUNTER — Encounter: Payer: Self-pay | Admitting: Hematology and Oncology

## 2013-09-29 ENCOUNTER — Telehealth: Payer: Self-pay | Admitting: Hematology and Oncology

## 2013-09-29 ENCOUNTER — Ambulatory Visit (HOSPITAL_BASED_OUTPATIENT_CLINIC_OR_DEPARTMENT_OTHER): Payer: BC Managed Care – PPO | Admitting: Hematology and Oncology

## 2013-09-29 ENCOUNTER — Other Ambulatory Visit (HOSPITAL_BASED_OUTPATIENT_CLINIC_OR_DEPARTMENT_OTHER): Payer: BC Managed Care – PPO

## 2013-09-29 ENCOUNTER — Ambulatory Visit: Payer: BC Managed Care – PPO

## 2013-09-29 DIAGNOSIS — G459 Transient cerebral ischemic attack, unspecified: Secondary | ICD-10-CM

## 2013-09-29 DIAGNOSIS — D473 Essential (hemorrhagic) thrombocythemia: Secondary | ICD-10-CM

## 2013-09-29 DIAGNOSIS — Z7982 Long term (current) use of aspirin: Secondary | ICD-10-CM

## 2013-09-29 DIAGNOSIS — D75839 Thrombocytosis, unspecified: Secondary | ICD-10-CM

## 2013-09-29 HISTORY — DX: Transient cerebral ischemic attack, unspecified: G45.9

## 2013-09-29 HISTORY — DX: Thrombocytosis, unspecified: D75.839

## 2013-09-29 LAB — CBC WITH DIFFERENTIAL/PLATELET
BASO%: 0.9 % (ref 0.0–2.0)
Basophils Absolute: 0.1 10*3/uL (ref 0.0–0.1)
EOS%: 1.8 % (ref 0.0–7.0)
Eosinophils Absolute: 0.1 10*3/uL (ref 0.0–0.5)
HEMATOCRIT: 43.7 % (ref 34.8–46.6)
HGB: 14.3 g/dL (ref 11.6–15.9)
LYMPH%: 28.5 % (ref 14.0–49.7)
MCH: 31.1 pg (ref 25.1–34.0)
MCHC: 32.8 g/dL (ref 31.5–36.0)
MCV: 94.8 fL (ref 79.5–101.0)
MONO#: 0.3 10*3/uL (ref 0.1–0.9)
MONO%: 4.5 % (ref 0.0–14.0)
NEUT#: 3.6 10*3/uL (ref 1.5–6.5)
NEUT%: 64.3 % (ref 38.4–76.8)
PLATELETS: 585 10*3/uL — AB (ref 145–400)
RBC: 4.62 10*6/uL (ref 3.70–5.45)
RDW: 13.1 % (ref 11.2–14.5)
WBC: 5.7 10*3/uL (ref 3.9–10.3)
lymph#: 1.6 10*3/uL (ref 0.9–3.3)

## 2013-09-29 LAB — PROTHROMBIN TIME
INR: 1.01 (ref <1.50)
PROTHROMBIN TIME: 13.3 s (ref 11.6–15.2)

## 2013-09-29 LAB — FERRITIN CHCC: Ferritin: 30 ng/ml (ref 9–269)

## 2013-09-29 LAB — APTT: aPTT: 28.9 seconds (ref 24–37)

## 2013-09-29 NOTE — Telephone Encounter (Signed)
Pt confirmed labs/ov per 07/28 POF, gave pt AVS....KJ °

## 2013-09-29 NOTE — Assessment & Plan Note (Signed)
This is suspicious for undiagnosed myeloproliferative disorder. I will order an additional workup. The patient is aware she may need bone marrow aspirate and biopsy if blood work is unremarkable. I recommend she takes 162 mg aspirin daily to reduce risk of thrombosis.

## 2013-09-29 NOTE — Progress Notes (Signed)
Checked in new pt with no financial concerns. °

## 2013-09-29 NOTE — Progress Notes (Signed)
[place] CONSULT NOTE  Patient Care Team: No Pcp Per Patient as PCP - General (General Practice)  CHIEF COMPLAINTS/PURPOSE OF CONSULTATION:  Thrombocytosis, recent TIA, mild peripheral neuropathy.   HISTORY OF PRESENTING ILLNESS:  Shannon Mcdonald 33 y.o. female is here because of elevated  platelet count.  She was found to have abnormal CBC from routine surveillance blood work. Her platelet count has ranged between 520,000-560,000. She denies recent infection. The last prescription antibiotics was more than 3 months ago. Starting around April of this year, she has noticed occasional tingling sensation in the hands and feet without activity. She also complained of non-drenching night sweats twice a week. The patient started to have regular headaches in different locations of the head and occasional chest tightness. She was started on Prilosec for possible reflux disease with no improvement. CT angiogram on 09/03/2013 was negative for PE. Recently, while traveling, she was prescribed aspirin. She was taking it periodically a week ago.  Several days ago, she ended up in the emergency department after transient ischemic attack affecting her with loss of motor control, numbness on the right side of her face and hand. CT scan of the head dated 09/28/2013 was negative for stroke. Her symptoms resolved within one hour.  There is not reported symptoms of sinus congestion, cough, urinary frequency/urgency or dysuria, diarrhea, joint swelling/pain or abnormal skin rash.  She had no prior history or diagnosis of cancer. Her age appropriate screening programs are up-to-date.  MEDICAL HISTORY:  Past Medical History  Diagnosis Date  . Allergy   . GERD (gastroesophageal reflux disease)   . Altitude sickness   . TIA (transient ischemic attack) 09/29/2013  . Thrombocytosis 09/29/2013    SURGICAL HISTORY: Past Surgical History  Procedure Laterality Date  . Nasal sinus surgery       SOCIAL HISTORY: History   Social History  . Marital Status: Single    Spouse Name: N/A    Number of Children: N/A  . Years of Education: N/A   Occupational History  . Not on file.   Social History Main Topics  . Smoking status: Never Smoker   . Smokeless tobacco: Never Used  . Alcohol Use: No  . Drug Use: No  . Sexual Activity: No   Other Topics Concern  . Not on file   Social History Narrative  . No narrative on file    FAMILY HISTORY: Family History  Problem Relation Age of Onset  . Hypertension Mother     ALLERGIES:  has No Known Allergies.  MEDICATIONS:  Current Outpatient Prescriptions  Medication Sig Dispense Refill  . aspirin EC 81 MG tablet Take 81 mg by mouth daily.      . fluticasone (FLONASE) 50 MCG/ACT nasal spray Place 1 spray into both nostrils daily as needed for allergies.       Marland Kitchen omeprazole (PRILOSEC) 10 MG capsule Take 10 mg by mouth daily as needed. For acid reflux       No current facility-administered medications for this visit.    REVIEW OF SYSTEMS:   Constitutional: Denies fevers, chills or abnormal night sweats Eyes: Denies blurriness of vision, double vision or watery eyes Ears, nose, mouth, throat, and face: Denies mucositis or sore throat Respiratory: Denies cough, dyspnea or wheezes Cardiovascular: Denies palpitation, chest discomfort or lower extremity swelling Gastrointestinal:  Denies nausea, heartburn or change in bowel habits Skin: Denies abnormal skin rashes Lymphatics: Denies new lymphadenopathy or easy bruising Behavioral/Psych: Mood is stable, no new changes  All other systems were reviewed with the patient and are negative.  PHYSICAL EXAMINATION: ECOG PERFORMANCE STATUS: 0 - Asymptomatic GENERAL:alert, no distress and comfortable SKIN: skin color, texture, turgor are normal, no rashes or significant lesions EYES: normal, conjunctiva are pink and non-injected, sclera clear OROPHARYNX:no exudate, no erythema and  lips, buccal mucosa, and tongue normal  NECK: supple, thyroid normal size, non-tender, without nodularity LYMPH:  no palpable lymphadenopathy in the cervical, axillary or inguinal LUNGS: clear to auscultation and percussion with normal breathing effort HEART: regular rate & rhythm and no murmurs and no lower extremity edema ABDOMEN:abdomen soft, non-tender and normal bowel sounds Musculoskeletal:no cyanosis of digits and no clubbing  PSYCH: alert & oriented x 3 with fluent speech NEURO: no focal motor/sensory deficits  LABORATORY DATA:  I have reviewed the data as listed Recent Results (from the past 2160 hour(s))  COMPREHENSIVE METABOLIC PANEL     Status: Abnormal   Collection Time    09/01/13  6:53 PM      Result Value Ref Range   Sodium 143  135 - 145 mEq/L   Potassium 4.5  3.5 - 5.3 mEq/L   Chloride 109  96 - 112 mEq/L   CO2 27  19 - 32 mEq/L   Glucose, Bld 81  70 - 99 mg/dL   BUN 15  6 - 23 mg/dL   Creat 0.90  0.50 - 1.10 mg/dL   Total Bilirubin 0.2  0.2 - 1.2 mg/dL   Alkaline Phosphatase 34 (*) 39 - 117 U/L   AST 17  0 - 37 U/L   ALT 20  0 - 35 U/L   Total Protein 6.3  6.0 - 8.3 g/dL   Albumin 4.2  3.5 - 5.2 g/dL   Calcium 9.3  8.4 - 10.5 mg/dL  D-DIMER, QUANTITATIVE     Status: Abnormal   Collection Time    09/01/13  6:53 PM      Result Value Ref Range   D-Dimer, Quant 1.79 (*) 0.00 - 0.48 ug/mL-FEU   Comment: At the inhouse established cutoff value of 0.48 ug/mL FEU, this     methology has been documented in the literature to have a sensitivity     and negative predictive value of at least 98-99%.  The test result     should be correlated with an assessment of the clinical probability of     DVT/VTE.  CBC WITH DIFFERENTIAL     Status: Abnormal   Collection Time    09/01/13  6:53 PM      Result Value Ref Range   WBC 6.2  4.0 - 10.5 K/uL   RBC 4.13  3.87 - 5.11 MIL/uL   Hemoglobin 12.9  12.0 - 15.0 g/dL   HCT 37.7  36.0 - 46.0 %   MCV 91.3  78.0 - 100.0 fL    MCH 31.2  26.0 - 34.0 pg   MCHC 34.2  30.0 - 36.0 g/dL   RDW 13.4  11.5 - 15.5 %   Platelets 520 (*) 150 - 400 K/uL   Neutrophils Relative % 62  43 - 77 %   Neutro Abs 3.8  1.7 - 7.7 K/uL   Lymphocytes Relative 30  12 - 46 %   Lymphs Abs 1.9  0.7 - 4.0 K/uL   Monocytes Relative 5  3 - 12 %   Monocytes Absolute 0.3  0.1 - 1.0 K/uL   Eosinophils Relative 2  0 - 5 %   Eosinophils Absolute 0.1  0.0 - 0.7 K/uL   Basophils Relative 1  0 - 1 %   Basophils Absolute 0.1  0.0 - 0.1 K/uL   Smear Review Criteria for review not met    POCT UA - MICROSCOPIC ONLY     Status: None   Collection Time    09/01/13  7:19 PM      Result Value Ref Range   WBC, Ur, HPF, POC 4-6     RBC, urine, microscopic 0-2     Bacteria, U Microscopic 2+     Mucus, UA 1+     Epithelial cells, urine per micros 3-6     Crystals, Ur, HPF, POC neg     Casts, Ur, LPF, POC neg     Yeast, UA neg    POCT URINALYSIS DIPSTICK     Status: None   Collection Time    09/01/13  7:19 PM      Result Value Ref Range   Color, UA yellow     Clarity, UA hazy     Glucose, UA neg     Bilirubin, UA neg     Ketones, UA neg     Spec Grav, UA 1.020     Blood, UA small     pH, UA 7.0     Protein, UA neg     Urobilinogen, UA 0.2     Nitrite, UA neg     Leukocytes, UA Negative    CK     Status: None   Collection Time    09/10/13  3:49 PM      Result Value Ref Range   Total CK 45  7 - 177 U/L  CBC     Status: Abnormal   Collection Time    09/10/13  3:49 PM      Result Value Ref Range   WBC 5.1  4.0 - 10.5 K/uL   RBC 4.76  3.87 - 5.11 MIL/uL   Hemoglobin 14.9  12.0 - 15.0 g/dL   HCT 42.9  36.0 - 46.0 %   MCV 90.1  78.0 - 100.0 fL   MCH 31.3  26.0 - 34.0 pg   MCHC 34.7  30.0 - 36.0 g/dL   RDW 13.3  11.5 - 15.5 %   Platelets 556 (*) 150 - 400 K/uL  COMPLETE METABOLIC PANEL WITH GFR     Status: None   Collection Time    09/10/13  3:49 PM      Result Value Ref Range   Sodium 140  135 - 145 mEq/L   Potassium 4.4  3.5 - 5.3  mEq/L   Chloride 105  96 - 112 mEq/L   CO2 29  19 - 32 mEq/L   Glucose, Bld 78  70 - 99 mg/dL   BUN 15  6 - 23 mg/dL   Creat 0.98  0.50 - 1.10 mg/dL   Total Bilirubin 0.7  0.2 - 1.2 mg/dL   Alkaline Phosphatase 41  39 - 117 U/L   AST 14  0 - 37 U/L   ALT 16  0 - 35 U/L   Total Protein 7.3  6.0 - 8.3 g/dL   Albumin 4.7  3.5 - 5.2 g/dL   Calcium 9.9  8.4 - 10.5 mg/dL   GFR, Est African American 88     GFR, Est Non African American 77     Comment:       The estimated GFR is a calculation valid for adults (>=56 years old)  that uses the CKD-EPI algorithm to adjust for age and sex. It is       not to be used for children, pregnant women, hospitalized patients,        patients on dialysis, or with rapidly changing kidney function.     According to the NKDEP, eGFR >89 is normal, 60-89 shows mild     impairment, 30-59 shows moderate impairment, 15-29 shows severe     impairment and <15 is ESRD.        TSH     Status: None   Collection Time    09/10/13  3:49 PM      Result Value Ref Range   TSH 1.638  0.350 - 4.500 uIU/mL  VITAMIN B12     Status: None   Collection Time    09/10/13  3:49 PM      Result Value Ref Range   Vitamin B-12 332  211 - 911 pg/mL  IRON     Status: None   Collection Time    09/10/13  3:49 PM      Result Value Ref Range   Iron 121  42 - 145 ug/dL  FOLATE     Status: None   Collection Time    09/10/13  3:49 PM      Result Value Ref Range   Folate 11.2     Comment:       Reference Ranges             Deficient:       0.4 - 3.3 ng/mL             Indeterminate:   3.4 - 5.4 ng/mL             Normal:              > 5.4 ng/mL        CBC WITH DIFFERENTIAL     Status: Abnormal   Collection Time    09/27/13 11:58 PM      Result Value Ref Range   WBC 8.0  4.0 - 10.5 K/uL   RBC 4.35  3.87 - 5.11 MIL/uL   Hemoglobin 13.8  12.0 - 15.0 g/dL   HCT 40.6  36.0 - 46.0 %   MCV 93.3  78.0 - 100.0 fL   MCH 31.7  26.0 - 34.0 pg   MCHC 34.0  30.0 - 36.0 g/dL   RDW  12.5  11.5 - 15.5 %   Platelets 530 (*) 150 - 400 K/uL   Neutrophils Relative % 68  43 - 77 %   Neutro Abs 5.4  1.7 - 7.7 K/uL   Lymphocytes Relative 26  12 - 46 %   Lymphs Abs 2.1  0.7 - 4.0 K/uL   Monocytes Relative 5  3 - 12 %   Monocytes Absolute 0.4  0.1 - 1.0 K/uL   Eosinophils Relative 1  0 - 5 %   Eosinophils Absolute 0.1  0.0 - 0.7 K/uL   Basophils Relative 0  0 - 1 %   Basophils Absolute 0.0  0.0 - 0.1 K/uL  COMPREHENSIVE METABOLIC PANEL     Status: Abnormal   Collection Time    09/27/13 11:58 PM      Result Value Ref Range   Sodium 143  137 - 147 mEq/L   Potassium 4.1  3.7 - 5.3 mEq/L   Chloride 103  96 - 112 mEq/L   CO2 27  19 - 32 mEq/L  Glucose, Bld 92  70 - 99 mg/dL   BUN 11  6 - 23 mg/dL   Creatinine, Ser 0.90  0.50 - 1.10 mg/dL   Calcium 9.1  8.4 - 10.5 mg/dL   Total Protein 7.2  6.0 - 8.3 g/dL   Albumin 4.3  3.5 - 5.2 g/dL   AST 16  0 - 37 U/L   ALT 10  0 - 35 U/L   Alkaline Phosphatase 42  39 - 117 U/L   Total Bilirubin 0.4  0.3 - 1.2 mg/dL   GFR calc non Af Amer 84 (*) >90 mL/min   GFR calc Af Amer >90  >90 mL/min   Comment: (NOTE)     The eGFR has been calculated using the CKD EPI equation.     This calculation has not been validated in all clinical situations.     eGFR's persistently <90 mL/min signify possible Chronic Kidney     Disease.   Anion gap 13  5 - 15  URINALYSIS, ROUTINE W REFLEX MICROSCOPIC     Status: Abnormal   Collection Time    09/28/13  1:32 AM      Result Value Ref Range   Color, Urine YELLOW  YELLOW   APPearance CLEAR  CLEAR   Specific Gravity, Urine 1.009  1.005 - 1.030   pH 6.5  5.0 - 8.0   Glucose, UA NEGATIVE  NEGATIVE mg/dL   Hgb urine dipstick LARGE (*) NEGATIVE   Bilirubin Urine NEGATIVE  NEGATIVE   Ketones, ur NEGATIVE  NEGATIVE mg/dL   Protein, ur NEGATIVE  NEGATIVE mg/dL   Urobilinogen, UA 0.2  0.0 - 1.0 mg/dL   Nitrite NEGATIVE  NEGATIVE   Leukocytes, UA NEGATIVE  NEGATIVE  PREGNANCY, URINE     Status: None    Collection Time    09/28/13  1:32 AM      Result Value Ref Range   Preg Test, Ur NEGATIVE  NEGATIVE   Comment:            THE SENSITIVITY OF THIS     METHODOLOGY IS >20 mIU/mL.  URINE MICROSCOPIC-ADD ON     Status: None   Collection Time    09/28/13  1:32 AM      Result Value Ref Range   Squamous Epithelial / LPF RARE  RARE   RBC / HPF 0-2  <3 RBC/hpf   Bacteria, UA RARE  RARE  CBC WITH DIFFERENTIAL     Status: Abnormal   Collection Time    09/29/13 11:14 AM      Result Value Ref Range   WBC 5.7  3.9 - 10.3 10e3/uL   NEUT# 3.6  1.5 - 6.5 10e3/uL   HGB 14.3  11.6 - 15.9 g/dL   HCT 43.7  34.8 - 46.6 %   Platelets 585 (*) 145 - 400 10e3/uL   MCV 94.8  79.5 - 101.0 fL   MCH 31.1  25.1 - 34.0 pg   MCHC 32.8  31.5 - 36.0 g/dL   RBC 4.62  3.70 - 5.45 10e6/uL   RDW 13.1  11.2 - 14.5 %   lymph# 1.6  0.9 - 3.3 10e3/uL   MONO# 0.3  0.1 - 0.9 10e3/uL   Eosinophils Absolute 0.1  0.0 - 0.5 10e3/uL   Basophils Absolute 0.1  0.0 - 0.1 10e3/uL   NEUT% 64.3  38.4 - 76.8 %   LYMPH% 28.5  14.0 - 49.7 %   MONO% 4.5  0.0 - 14.0 %  EOS% 1.8  0.0 - 7.0 %   BASO% 0.9  0.0 - 2.0 %    RADIOGRAPHIC STUDIES: I have personally reviewed the radiological images as listed and agreed with the findings in the report.  Ct Head Wo Contrast  09/28/2013   CLINICAL DATA:  Headache.  Right-sided numbness.  EXAM: CT HEAD WITHOUT CONTRAST  TECHNIQUE: Contiguous axial images were obtained from the base of the skull through the vertex without intravenous contrast.  COMPARISON:  None.  FINDINGS: No mass lesion. No midline shift. No acute hemorrhage or hematoma. No extra-axial fluid collections. No evidence of acute infarction. Brain parenchyma is normal. Osseous structures are normal.  IMPRESSION: Normal exam.   Electronically Signed   By: Rozetta Nunnery M.D.   On: 09/28/2013 00:20   Ct Angio Chest W/cm &/or Wo Cm  09/03/2013   CLINICAL DATA:  Short of breath for 5 days.  Increased D-dimer.  EXAM: CT ANGIOGRAPHY CHEST  WITH CONTRAST  TECHNIQUE: Multidetector CT imaging of the chest was performed using the standard protocol during bolus administration of intravenous contrast. Multiplanar CT image reconstructions and MIPs were obtained to evaluate the vascular anatomy.  CONTRAST:  160mL OMNIPAQUE IOHEXOL 350 MG/ML SOLN  COMPARISON:  Chest radiograph, 09/01/2013  FINDINGS: No evidence of a pulmonary embolus.  Heart is normal in size and configuration. Great vessels are normal in caliber. No aortic dissection. No neck base, axillary, mediastinal or hilar masses or pathologically enlarged lymph nodes.  Trace bilateral pleural effusions. There is mild dependent subsegmental atelectasis. No lung consolidation or edema. No lung mass or nodule.  Limited evaluation of the upper abdomen demonstrates a 7 mm low-density liver lesion at the dome likely a cyst.  There is a mild dextroscoliosis of the thoracic spine. Bony structures otherwise unremarkable.  Review of the MIP images confirms the above findings.  IMPRESSION: 1. No evidence of a pulmonary embolus. 2. Trace bilateral pleural effusions with mild dependent subsegmental atelectasis. 3. No lung consolidation to suggest pneumonia.  No pulmonary edema. 4. Mild dextroscoliosis of the thoracic spine. 5. No other abnormalities.   Electronically Signed   By: Lajean Manes M.D.   On: 09/03/2013 12:15    ASSESSMENT & PLAN Thrombocytosis This is suspicious for undiagnosed myeloproliferative disorder. I will order an additional workup. The patient is aware she may need bone marrow aspirate and biopsy if blood work is unremarkable. I recommend she takes 162 mg aspirin daily to reduce risk of thrombosis.  TIA (transient ischemic attack) She has transient neurological symptoms suspicious for microthrombi. I recommend aspirin therapy.

## 2013-09-29 NOTE — Assessment & Plan Note (Addendum)
She has transient neurological symptoms suspicious for microthrombi. I recommend aspirin therapy.

## 2013-10-01 LAB — LUPUS ANTICOAGULANT PANEL
DRVVT: 31.6 secs (ref <42.9)
LUPUS ANTICOAGULANT: NOT DETECTED
PTT Lupus Anticoagulant: 39.9 secs (ref 28.0–43.0)

## 2013-10-09 ENCOUNTER — Ambulatory Visit (HOSPITAL_BASED_OUTPATIENT_CLINIC_OR_DEPARTMENT_OTHER): Payer: BC Managed Care – PPO | Admitting: Hematology and Oncology

## 2013-10-09 ENCOUNTER — Encounter: Payer: Self-pay | Admitting: Hematology and Oncology

## 2013-10-09 VITALS — BP 133/87 | Ht 65.0 in | Wt 146.5 lb

## 2013-10-09 DIAGNOSIS — D471 Chronic myeloproliferative disease: Secondary | ICD-10-CM

## 2013-10-09 DIAGNOSIS — D473 Essential (hemorrhagic) thrombocythemia: Secondary | ICD-10-CM

## 2013-10-09 DIAGNOSIS — D75839 Thrombocytosis, unspecified: Secondary | ICD-10-CM

## 2013-10-09 DIAGNOSIS — G459 Transient cerebral ischemic attack, unspecified: Secondary | ICD-10-CM

## 2013-10-09 DIAGNOSIS — D47Z9 Other specified neoplasms of uncertain behavior of lymphoid, hematopoietic and related tissue: Secondary | ICD-10-CM

## 2013-10-09 NOTE — Assessment & Plan Note (Signed)
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 We discussed about the risk and benefit of bone marrow aspirate and biopsy. At present time, it would not change management. We also discussed about various treatment options including hydroxyurea, anagrelide or observation only. Given her young age, I would prefer to avoid starting her on treatment right now. I recommend increasing her aspirin to 325 mg and I educated the patient and family members about signs and symptoms to watch out for. If she started to have evidence of recurrence of TIA, he will make a strong the case to start her on treatment. I also recommend consideration for a second opinion.

## 2013-10-09 NOTE — Assessment & Plan Note (Addendum)
This has resolved. I recommend increasing aspirin to 325 mg daily.

## 2013-10-09 NOTE — Progress Notes (Signed)
[facility] [place] OFFICE PROGRESS NOTE  Patient Care Team: No Pcp Per Patient as PCP - General (General Practice)  SUMMARY OF ONCOLOGIC HISTORY:  She was found to have abnormal CBC from routine surveillance blood work. Her platelet count has ranged between 520,000-560,000. She denies recent infection. The last prescription antibiotics was more than 3 months ago. Starting around April of this year, she has noticed occasional tingling sensation in the hands and feet without activity. She also complained of non-drenching night sweats twice a week. The patient started to have regular headaches in different locations of the head and occasional chest tightness. She was started on Prilosec for possible reflux disease with no improvement. CT angiogram on 09/03/2013 was negative for PE. Recently, while traveling, she was prescribed aspirin. She was taking it periodically a week ago.  Several days ago, she ended up in the emergency department after transient ischemic attack affecting her with loss of motor control, numbness on the right side of her face and hand. CT scan of the head dated 09/28/2013 was negative for stroke. Her symptoms resolved within one hour.  INTERVAL HISTORY: Please see below for problem oriented charting. She feels well since this are aspirin therapy. She denies recurrence of headaches. Denies new neurological deficits.  REVIEW OF SYSTEMS:   Constitutional: Denies fevers, chills or abnormal weight loss Eyes: Denies blurriness of vision Ears, nose, mouth, throat, and face: Denies mucositis or sore throat Respiratory: Denies cough, dyspnea or wheezes Cardiovascular: Denies palpitation, chest discomfort or lower extremity swelling Gastrointestinal:  Denies nausea, heartburn or change in bowel habits Skin: Denies abnormal skin rashes Lymphatics: Denies new lymphadenopathy or easy bruising Neurological:Denies numbness, tingling or new weaknesses Behavioral/Psych: Mood  is stable, no new changes  All other systems were reviewed with the patient and are negative.  I have reviewed the past medical history, past surgical history, social history and family history with the patient and they are unchanged from previous note.  ALLERGIES:  has No Known Allergies.  MEDICATIONS:  Current Outpatient Prescriptions  Medication Sig Dispense Refill  . aspirin EC 81 MG tablet Take 162 mg by mouth daily.       . fluticasone (FLONASE) 50 MCG/ACT nasal spray Place 1 spray into both nostrils daily as needed for allergies.       . Multiple Vitamin (MULTIVITAMIN) tablet Take 1 tablet by mouth daily.      Marland Kitchen omeprazole (PRILOSEC) 10 MG capsule Take 10 mg by mouth daily as needed. For acid reflux       No current facility-administered medications for this visit.    PHYSICAL EXAMINATION: ECOG PERFORMANCE STATUS: 0 - Asymptomatic  Filed Vitals:   10/09/13 1411  BP: 133/87   Filed Weights   10/09/13 1411  Weight: 146 lb 8 oz (66.452 kg)    GENERAL:alert, no distress and comfortable SKIN: skin color, texture, turgor are normal, no rashes or significant lesions EYES: normal, Conjunctiva are pink and non-injected, sclera clear Musculoskeletal:no cyanosis of digits and no clubbing  NEURO: alert & oriented x 3 with fluent speech, no focal motor/sensory deficits  LABORATORY DATA:  I have reviewed the data as listed    Component Value Date/Time   NA 143 09/27/2013 2358   K 4.1 09/27/2013 2358   CL 103 09/27/2013 2358   CO2 27 09/27/2013 2358   GLUCOSE 92 09/27/2013 2358   BUN 11 09/27/2013 2358   CREATININE 0.90 09/27/2013 2358   CREATININE 0.98 09/10/2013 1549   CALCIUM 9.1 09/27/2013  2358   PROT 7.2 09/27/2013 2358   ALBUMIN 4.3 09/27/2013 2358   AST 16 09/27/2013 2358   ALT 10 09/27/2013 2358   ALKPHOS 42 09/27/2013 2358   BILITOT 0.4 09/27/2013 2358   GFRNONAA 84* 09/27/2013 2358   GFRNONAA 77 09/10/2013 1549   GFRAA >90 09/27/2013 2358   GFRAA 88 09/10/2013 1549    No  results found for this basename: SPEP,  UPEP,   kappa and lambda light chains    Lab Results  Component Value Date   WBC 5.7 09/29/2013   NEUTROABS 3.6 09/29/2013   HGB 14.3 09/29/2013   HCT 43.7 09/29/2013   MCV 94.8 09/29/2013   PLT 585* 09/29/2013      Chemistry      Component Value Date/Time   NA 143 09/27/2013 2358   K 4.1 09/27/2013 2358   CL 103 09/27/2013 2358   CO2 27 09/27/2013 2358   BUN 11 09/27/2013 2358   CREATININE 0.90 09/27/2013 2358   CREATININE 0.98 09/10/2013 1549      Component Value Date/Time   CALCIUM 9.1 09/27/2013 2358   ALKPHOS 42 09/27/2013 2358   AST 16 09/27/2013 2358   ALT 10 09/27/2013 2358   BILITOT 0.4 09/27/2013 2358     ASSESSMENT & PLAN:  Myeloproliferative neoplasm 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 We discussed about the risk and benefit of bone marrow aspirate and biopsy. At present time, it would not change management. We also discussed about various treatment options including hydroxyurea, anagrelide or observation only. Given her young age, I would prefer to avoid starting her on treatment right now. I recommend increasing her aspirin to 325 mg and I educated the patient and family members about signs and symptoms to watch out for. If she started to have evidence of recurrence of TIA, he will make a strong the case to start her on treatment. I also recommend consideration for a second opinion.  TIA (transient ischemic attack) This has resolved. I recommend increasing aspirin to 325 mg daily.  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 Recommend aspirin therapy.   we also discussed about strategies to reduce the likelihood of blood clot such as frequent ambulation, prevention of dehydration and avoidance of birth control pills or hormonal replacement therapy.  Orders Placed This Encounter  Procedures  . CBC with Differential    Standing Status: Future      Number of Occurrences:      Standing Expiration Date: 11/13/2014   All questions were answered. The patient knows to call the clinic with any problems, questions or concerns. No barriers to learning was detected. I spent 30 minutes counseling the patient face to face. The total time spent in the appointment was 40 minutes and more than 50% was on counseling and review of test results     [facility], [place], MD 10/09/2013 2:53 PM

## 2013-10-09 NOTE — Assessment & Plan Note (Signed)
This is likely related to her essential thrombocytosis. Recommend aspirin therapy.

## 2013-10-11 ENCOUNTER — Encounter: Payer: Self-pay | Admitting: Hematology and Oncology

## 2013-10-12 NOTE — Telephone Encounter (Signed)
Collaborative nurse called Marcie Bal RN with Triage reporting she has printed this for provider and is taking care of this.

## 2013-10-14 ENCOUNTER — Telehealth: Payer: Self-pay | Admitting: Hematology and Oncology

## 2013-10-14 NOTE — Telephone Encounter (Signed)
, °

## 2013-10-25 ENCOUNTER — Other Ambulatory Visit: Payer: Self-pay | Admitting: Hematology and Oncology

## 2013-10-30 ENCOUNTER — Encounter (HOSPITAL_COMMUNITY): Payer: Self-pay | Admitting: Emergency Medicine

## 2013-10-30 ENCOUNTER — Emergency Department (HOSPITAL_COMMUNITY)
Admission: EM | Admit: 2013-10-30 | Discharge: 2013-10-30 | Disposition: A | Discharge status: Home / Self Care | Payer: BC Managed Care – PPO | Attending: Emergency Medicine | Admitting: Emergency Medicine

## 2013-10-30 ENCOUNTER — Emergency Department (HOSPITAL_COMMUNITY): Payer: BC Managed Care – PPO

## 2013-10-30 DIAGNOSIS — Z862 Personal history of diseases of the blood and blood-forming organs and certain disorders involving the immune mechanism: Secondary | ICD-10-CM | POA: Diagnosis not present

## 2013-10-30 DIAGNOSIS — R0789 Other chest pain: Secondary | ICD-10-CM | POA: Diagnosis not present

## 2013-10-30 DIAGNOSIS — Z8673 Personal history of transient ischemic attack (TIA), and cerebral infarction without residual deficits: Secondary | ICD-10-CM | POA: Diagnosis not present

## 2013-10-30 DIAGNOSIS — K219 Gastro-esophageal reflux disease without esophagitis: Secondary | ICD-10-CM | POA: Diagnosis not present

## 2013-10-30 DIAGNOSIS — Z7982 Long term (current) use of aspirin: Secondary | ICD-10-CM | POA: Diagnosis not present

## 2013-10-30 DIAGNOSIS — R0602 Shortness of breath: Secondary | ICD-10-CM | POA: Insufficient documentation

## 2013-10-30 DIAGNOSIS — Z87828 Personal history of other (healed) physical injury and trauma: Secondary | ICD-10-CM | POA: Insufficient documentation

## 2013-10-30 DIAGNOSIS — Z3202 Encounter for pregnancy test, result negative: Secondary | ICD-10-CM | POA: Diagnosis not present

## 2013-10-30 LAB — CBC WITH DIFFERENTIAL/PLATELET
Basophils Absolute: 0 10*3/uL (ref 0.0–0.1)
Basophils Relative: 0 % (ref 0–1)
Eosinophils Absolute: 0.1 10*3/uL (ref 0.0–0.7)
Eosinophils Relative: 2 % (ref 0–5)
HCT: 41.3 % (ref 36.0–46.0)
HEMOGLOBIN: 14.3 g/dL (ref 12.0–15.0)
LYMPHS ABS: 1.4 10*3/uL (ref 0.7–4.0)
Lymphocytes Relative: 29 % (ref 12–46)
MCH: 31.6 pg (ref 26.0–34.0)
MCHC: 34.6 g/dL (ref 30.0–36.0)
MCV: 91.2 fL (ref 78.0–100.0)
MONOS PCT: 5 % (ref 3–12)
Monocytes Absolute: 0.2 10*3/uL (ref 0.1–1.0)
NEUTROS PCT: 64 % (ref 43–77)
Neutro Abs: 3.1 10*3/uL (ref 1.7–7.7)
Platelets: 461 10*3/uL — ABNORMAL HIGH (ref 150–400)
RBC: 4.53 MIL/uL (ref 3.87–5.11)
RDW: 12.6 % (ref 11.5–15.5)
WBC: 4.9 10*3/uL (ref 4.0–10.5)

## 2013-10-30 LAB — COMPREHENSIVE METABOLIC PANEL
ALK PHOS: 41 U/L (ref 39–117)
ALT: 13 U/L (ref 0–35)
ANION GAP: 13 (ref 5–15)
AST: 14 U/L (ref 0–37)
Albumin: 4.2 g/dL (ref 3.5–5.2)
BUN: 10 mg/dL (ref 6–23)
CO2: 24 meq/L (ref 19–32)
Calcium: 9.3 mg/dL (ref 8.4–10.5)
Chloride: 101 mEq/L (ref 96–112)
Creatinine, Ser: 0.96 mg/dL (ref 0.50–1.10)
GFR, EST AFRICAN AMERICAN: 90 mL/min — AB (ref >90)
GFR, EST NON AFRICAN AMERICAN: 77 mL/min — AB (ref >90)
GLUCOSE: 102 mg/dL — AB (ref 70–99)
POTASSIUM: 3.6 meq/L — AB (ref 3.7–5.3)
Sodium: 138 mEq/L (ref 137–147)
Total Bilirubin: 0.6 mg/dL (ref 0.3–1.2)
Total Protein: 7.3 g/dL (ref 6.0–8.3)

## 2013-10-30 LAB — D-DIMER, QUANTITATIVE (NOT AT ARMC)

## 2013-10-30 LAB — PROTIME-INR
INR: 1.11 (ref 0.00–1.49)
Prothrombin Time: 14.3 seconds (ref 11.6–15.2)

## 2013-10-30 LAB — PREGNANCY, URINE: Preg Test, Ur: NEGATIVE

## 2013-10-30 LAB — APTT: aPTT: 33 s (ref 24–37)

## 2013-10-30 LAB — TROPONIN I: Troponin I: <0.3 ng/mL (ref <0.30)

## 2013-10-30 MED ORDER — SODIUM CHLORIDE 0.9 % IV SOLN
INTRAVENOUS | Status: DC
  Administered 2013-10-30: 11:00:00 via INTRAVENOUS

## 2013-10-30 MED ORDER — IOHEXOL 350 MG/ML SOLN
100.0000 mL | Freq: Once | INTRAVENOUS | Status: AC | PRN
  Administered 2013-10-30: 100 mL via INTRAVENOUS

## 2013-10-30 MED ORDER — ACETAMINOPHEN 500 MG PO TABS
1000.0000 mg | ORAL_TABLET | Freq: Once | ORAL | Status: AC
  Administered 2013-10-30: 1000 mg via ORAL
  Filled 2013-10-30: qty 2

## 2013-10-30 MED ORDER — NITROGLYCERIN 2 % TD OINT
1.0000 [in_us] | TOPICAL_OINTMENT | Freq: Once | TRANSDERMAL | Status: AC
  Administered 2013-10-30: 1 [in_us] via TOPICAL
  Filled 2013-10-30: qty 1

## 2013-10-30 NOTE — ED Provider Notes (Signed)
CSN: [id number]     Arrival date & time 10/30/13  0945 History   First MD Initiated Contact with Patient 10/30/13 [phone]     Chief Complaint  Patient presents with  . Chest Pain  . 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 This was diagnosed in the past couple months. She had presented with TIA symptoms on July 26 with negative evaluation for stroke. She also states she had a CT angiogram and about 2 months ago when she had shortness of breath after returning from a hiking trip at high altitude in [place] that was also negative for a blood clot. She states for the past few days she has had chest pain that was initially left-sided and now is more central. She describes the pain as a tightness. She states today her chest discomfort isn't quite as bad. The chest pain has been off and on however at last most of the day. She states increased movement such as walking makes the pain worse. Nothing she does makes it feel better. She has tried Tylenol without success. She states today she has had some shortness of breath. She states a few days ago she did have an episode when her heart was racing or about 10-15 minutes. She has intermittent mild achiness in her calves that she was told from her thrombocytosis. 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 She denies any active pain or swelling today. She has never been diagnosed with DVT, PE, or stroke. She's also had a mild cough for couple days with clear mucus. She denies shortness of breath, rhinorrhea, or fever. She has had some chills. She reports when she has the chest pain her left hand feels cold. She states she's had this chest pain and shortness of breath before however it has never lasted this long. She has increased her aspirin recently since her diagnosis from 81 mg a day to 281 mg tablets  a day twice a day. She took 325 mg of aspirin about 9 AM this morning. She states her pain today as a 4/10, at its worse it was a 7/10 yesterday.  Patient does not have a family history coronary artery disease. She states that hypertension runs in her mother and her mother's parents side of the family.   PCP none Hematologist Dr Alvy Bimler  Past Medical History  Diagnosis Date  . Allergy   . GERD (gastroesophageal reflux disease)   . Altitude sickness   . TIA (transient ischemic attack) 09/29/2013  . Thrombocytosis 09/29/2013   Past Surgical History  Procedure Laterality Date  . Nasal sinus surgery     Family History  Problem Relation Age of Onset  . Hypertension Mother    History  Substance Use Topics  . Smoking status: Never Smoker   . Smokeless tobacco: Never Used  . Alcohol Use: No   Just graduated from college, starting new job next month  OB History   Grav Para Term Preterm Abortions TAB SAB Ect Mult Living                 Review of Systems  All other systems reviewed and are negative.     Allergies  Review of patient's allergies indicates no known allergies.  Home Medications   Prior to Admission medications   Medication Sig Start Date End Date Taking? Authorizing Provider  aspirin EC 81 MG tablet Take 162 mg  by mouth twice daily.    Yes Historical Provider, MD  Multiple Vitamin (MULTIVITAMIN) tablet Take 1 tablet by mouth daily.   Yes Historical Provider, MD  omeprazole (PRILOSEC) 10 MG capsule Take 10 mg by mouth daily as needed (for acid reflux).    Yes Historical Provider, MD   BP 137/93  Pulse 86  Temp(Src) 97.9 F (36.6 C) (Oral)  Resp 22  Ht 5\' 5"  (1.651 m)  Wt 140 lb (63.504 kg)  BMI 23.30 kg/m2  SpO2 100%  LMP 10/30/2013  Vital signs normal   Physical Exam  Nursing note and vitals reviewed. Constitutional: She is oriented to person, place, and time. She appears well-developed and well-nourished.  Non-toxic appearance. She does not appear  ill. No distress.  HENT:  Head: Normocephalic and atraumatic.  Right Ear: External ear normal.  Left Ear: External ear normal.  Nose: Nose normal. No mucosal edema or rhinorrhea.  Mouth/Throat: Oropharynx is clear and moist and mucous membranes are normal. No dental abscesses or uvula swelling.  Eyes: Conjunctivae and EOM are normal. Pupils are equal, round, and reactive to light.  Neck: Normal range of motion and full passive range of motion without pain. Neck supple.  Cardiovascular: Normal rate, regular rhythm and normal heart sounds.  Exam reveals no gallop and no friction rub.   No murmur heard. Pulmonary/Chest: Effort normal and breath sounds normal. No respiratory distress. She has no wheezes. She has no rhonchi. She has no rales. She exhibits tenderness. She exhibits no crepitus.    Pt has mild anterior chest wall tenderness  Abdominal: Soft. Normal appearance and bowel sounds are normal. She exhibits no distension. There is no tenderness. There is no rebound and no guarding.  Musculoskeletal: Normal range of motion. She exhibits no edema and no tenderness.  Moves all extremities well. Mild discomfort in her calves  Neurological: She is alert and oriented to person, place, and time. She has normal strength. No cranial nerve deficit.  Skin: Skin is warm, dry and intact. No rash noted. No erythema. No pallor.  Psychiatric: She has a normal mood and affect. Her speech is normal and behavior is normal. Her mood appears not anxious.    ED Course  Procedures (including critical care time)   Patient's prior studies were reviewed. She did have a positive d-dimer in July. I am going to repeat it Gorsuch's notes were reviewed.  10:15 D/W Dr Clovis Riley radiology, feels CT angio chest would be less radiation than V/Q scan.  Labs Review  Pt ambulated with pulse ox of 85-99% on RA.  PT given her test results. She seems anxious and is starting a new job soon. We discussed getting a portable  finger tip oxygen monitor to see if she has significant lowering of her oxygen levels.    Results for orders placed during the hospital encounter of 10/30/13  CBC WITH DIFFERENTIAL      Result Value Ref Range   WBC 4.9  4.0 - 10.5 K/uL   RBC 4.53  3.87 - 5.11 MIL/uL   Hemoglobin 14.3  12.0 - 15.0 g/dL   HCT 41.3  36.0 - 46.0 %   MCV 91.2  78.0 - 100.0 fL   MCH 31.6  26.0 - 34.0 pg   MCHC 34.6  30.0 - 36.0 g/dL   RDW 12.6  11.5 - 15.5 %   Platelets 461 (*) 150 - 400 K/uL   Neutrophils Relative % 64  43 - 77 %   Neutro Abs  3.1  1.7 - 7.7 K/uL   Lymphocytes Relative 29  12 - 46 %   Lymphs Abs 1.4  0.7 - 4.0 K/uL   Monocytes Relative 5  3 - 12 %   Monocytes Absolute 0.2  0.1 - 1.0 K/uL   Eosinophils Relative 2  0 - 5 %   Eosinophils Absolute 0.1  0.0 - 0.7 K/uL   Basophils Relative 0  0 - 1 %   Basophils Absolute 0.0  0.0 - 0.1 K/uL  COMPREHENSIVE METABOLIC PANEL      Result Value Ref Range   Sodium 138  137 - 147 mEq/L   Potassium 3.6 (*) 3.7 - 5.3 mEq/L   Chloride 101  96 - 112 mEq/L   CO2 24  19 - 32 mEq/L   Glucose, Bld 102 (*) 70 - 99 mg/dL   BUN 10  6 - 23 mg/dL   Creatinine, Ser 0.96  0.50 - 1.10 mg/dL   Calcium 9.3  8.4 - 10.5 mg/dL   Total Protein 7.3  6.0 - 8.3 g/dL   Albumin 4.2  3.5 - 5.2 g/dL   AST 14  0 - 37 U/L   ALT 13  0 - 35 U/L   Alkaline Phosphatase 41  39 - 117 U/L   Total Bilirubin 0.6  0.3 - 1.2 mg/dL   GFR calc non Af Amer 77 (*) >90 mL/min   GFR calc Af Amer 90 (*) >90 mL/min   Anion gap 13  5 - 15  TROPONIN I      Result Value Ref Range   Troponin I <0.30  <0.30 ng/mL  D-DIMER, QUANTITATIVE      Result Value Ref Range   D-Dimer, Quant <0.27  0.00 - 0.48 ug/mL-FEU  APTT      Result Value Ref Range   aPTT 33  24 - 37 seconds  PROTIME-INR      Result Value Ref Range   Prothrombin Time 14.3  11.6 - 15.2 seconds   INR 1.11  0.00 - 1.49  PREGNANCY, URINE      Result Value Ref Range   Preg Test, Ur NEGATIVE  NEGATIVE   Laboratory  interpretation all normal except mild low potassium      Imaging Review Ct Angio Chest W/cm &/or Wo Cm  10/30/2013   CLINICAL DATA:  Chest pain, shortness of breath.  EXAM: CT ANGIOGRAPHY CHEST WITH CONTRAST  TECHNIQUE: Multidetector CT imaging of the chest was performed using the standard protocol during bolus administration of intravenous contrast. Multiplanar CT image reconstructions and MIPs were obtained to evaluate the vascular anatomy.  CONTRAST:  165mL OMNIPAQUE IOHEXOL 350 MG/ML SOLN  COMPARISON:  CT scan of September 03, 2013.  FINDINGS: No pneumothorax or pleural effusion is noted. No acute pulmonary disease is noted. Visualized portion of upper abdomen demonstrates stable hepatic cyst. There is no evidence of pulmonary embolus. No mediastinal mass or adenopathy is noted. No osseous abnormality is noted.  Review of the MIP images confirms the above findings.  IMPRESSION: No evidence of pulmonary embolus. No significant abnormality seen in the chest.   Electronically Signed   By: Sabino Dick M.D.   On: 10/30/2013 12:26     EKG Interpretation   Date/Time:  Friday October 30 2013 09:48:39 EDT Ventricular Rate:  73 PR Interval:  142 QRS Duration: 94 QT Interval:  402 QTC Calculation: 442 R Axis:   47 Text Interpretation:  Normal sinus rhythm Normal ECG Baseline wander Since  last tracing rate slower (27 Sep 2013) Confirmed by Cara Aguino  MD-I, Priyanka Causey  ([id number]) on 10/30/2013 9:53:01 AM      MDM   Final diagnoses:  Shortness of breath  Chest tightness    Plan discharge  Rolland Porter, MD, Alanson Aly, MD 10/30/13 1315

## 2013-10-30 NOTE — ED Notes (Signed)
Pt reports having mid chest pains intermittent for days. This am was feeling lightheaded and sob.denies cough. Airway intact and ekg done at triage.

## 2013-10-30 NOTE — ED Notes (Signed)
Pt ambulated in hallway on Pulse Oximetry. Pt ranged from 95%-99%.

## 2013-10-30 NOTE — Discharge Instructions (Signed)
Continue your aspirin. Consider getting a portable fingertip pulse oximeter to monitor your oxygen levels. Recheck if you get a fever, have severe pain, struggle to breathe.

## 2013-11-02 ENCOUNTER — Telehealth: Payer: Self-pay | Admitting: Hematology and Oncology

## 2013-11-02 ENCOUNTER — Telehealth: Payer: Self-pay | Admitting: *Deleted

## 2013-11-02 NOTE — Telephone Encounter (Signed)
Pt states she has appt to see MD below in October.  She does request Dr. Alvy Bimler send referrral and we send her records.  Informed pt Dr. Alvy Bimler would like to see her for f/u of recent ED visit.  Pt can come tomorrow at 3:30 pm.   Notified Scheduler of request.

## 2013-11-02 NOTE — Telephone Encounter (Signed)
added pt appt sched for Sept....pt aware per pof

## 2013-11-02 NOTE — Telephone Encounter (Signed)
Left message to call regarding message below. Can she come in on Wednesday?

## 2013-11-02 NOTE — Telephone Encounter (Signed)
Message copied by Cathlean Cower on Mon Nov 02, 2013  9:33 AM ------      Message from: [facility], [place]      Created: Sun Oct 25, 2013  8:14 PM      Regarding: clinical trial       [place]             [place]. Lucky Rathke      Ph: [phone]      Email: [email or web address]            I found this physician that can take care of her. Can you call her and see if she wants a referral now or after she moves? ------

## 2013-11-02 NOTE — Telephone Encounter (Signed)
Message copied by Patton Salles on Mon Nov 02, 2013  2:01 PM ------      Message from: [facility], [place]: Mon Nov 02, 2013 10:32 AM      Regarding: ED follow-up       Can you call to see how she is doing? Can she come Wednesday for post-ED follow-up?      ----- Message -----         From: Heath Lark, MD         Sent: 10/21/2013   9:26 PM           To: Heath Lark, MD      Subject: check                                                           ------

## 2013-11-03 ENCOUNTER — Encounter: Payer: Self-pay | Admitting: Hematology and Oncology

## 2013-11-03 ENCOUNTER — Ambulatory Visit (HOSPITAL_BASED_OUTPATIENT_CLINIC_OR_DEPARTMENT_OTHER): Payer: BC Managed Care – PPO | Admitting: Hematology and Oncology

## 2013-11-03 VITALS — BP 132/85 | HR 79 | Temp 97.8°F | Resp 18 | Ht 65.0 in | Wt 143.7 lb

## 2013-11-03 DIAGNOSIS — G459 Transient cerebral ischemic attack, unspecified: Secondary | ICD-10-CM

## 2013-11-03 DIAGNOSIS — D471 Chronic myeloproliferative disease: Secondary | ICD-10-CM

## 2013-11-03 DIAGNOSIS — Z23 Encounter for immunization: Secondary | ICD-10-CM

## 2013-11-03 DIAGNOSIS — D47Z9 Other specified neoplasms of uncertain behavior of lymphoid, hematopoietic and related tissue: Secondary | ICD-10-CM

## 2013-11-03 DIAGNOSIS — R002 Palpitations: Secondary | ICD-10-CM

## 2013-11-03 MED ORDER — INFLUENZA VAC SPLIT QUAD 0.5 ML IM SUSY: 0.5000 mL | PREFILLED_SYRINGE | Freq: Once | INTRAMUSCULAR | Status: DC

## 2013-11-03 NOTE — Assessment & Plan Note (Signed)
Has not recurred. She will continue on aspirin daily.

## 2013-11-03 NOTE — Assessment & Plan Note (Signed)
The cause is unknown and she had completed extensive workup in the emergency department recently. I reassured the patient.

## 2013-11-03 NOTE — Progress Notes (Signed)
[facility] OFFICE PROGRESS NOTE  Patient Care Team: No Pcp Per Patient as PCP - General (General Practice)  SUMMARY OF ONCOLOGIC HISTORY: She was found to have abnormal CBC from routine surveillance blood work. Her platelet count has ranged between 520,000-560,000. She denies recent infection. The last prescription antibiotics was more than 3 months ago. Starting around April of this year, she has noticed occasional tingling sensation in the hands and feet without activity. She also complained of non-drenching night sweats twice a week. The patient started to have regular headaches in different locations of the head and occasional chest tightness. She was started on Prilosec for possible reflux disease with no improvement. CT angiogram on 09/03/2013 was negative for PE. Recently, while traveling, she was prescribed aspirin. She was taking it periodically a week ago.  Several days ago, she ended up in the emergency department after transient ischemic attack affecting her with loss of motor control, numbness on the right side of her face and hand. CT scan of the head dated 09/28/2013 was negative for stroke. Her symptoms resolved within one hour. Peripheral blood test confirmed positive for JAK 2 mutation. INTERVAL HISTORY: Please see below for problem oriented charting. She was recently seen in the emergency department for chest tightness. CT angiogram was negative for PE and d-dimer was negative.  REVIEW OF SYSTEMS:   Constitutional: Denies fevers, chills or abnormal weight loss Eyes: Denies blurriness of vision Ears, nose, mouth, throat, and face: Denies mucositis or sore throat Respiratory: Denies cough, dyspnea or wheezes Cardiovascular: Denies palpitation, chest discomfort or lower extremity swelling Gastrointestinal:  Denies nausea, heartburn or change in bowel habits Skin: Denies abnormal skin rashes Lymphatics: Denies new lymphadenopathy or easy  bruising Neurological:Denies numbness, tingling or new weaknesses Behavioral/Psych: Mood is stable, no new changes  All other systems were reviewed with the patient and are negative.  I have reviewed the past medical history, past surgical history, social history and family history with the patient and they are unchanged from previous note.  ALLERGIES:  has No Known Allergies.  MEDICATIONS:  Current Outpatient Prescriptions  Medication Sig Dispense Refill  . Multiple Vitamin (MULTIVITAMIN) tablet Take 1 tablet by mouth daily.      Marland Kitchen omeprazole (PRILOSEC) 10 MG capsule Take 10 mg by mouth daily as needed (for acid reflux).       Marland Kitchen aspirin EC 81 MG tablet Take 162 mg by mouth 2 (two) times daily.        No current facility-administered medications for this visit.    PHYSICAL EXAMINATION: ECOG PERFORMANCE STATUS: 0 - Asymptomatic  Filed Vitals:   11/03/13 1541  BP: 132/85  Pulse: 79  Temp: 97.8 F (36.6 C)  Resp: 18   Filed Weights   11/03/13 1541  Weight: 143 lb 11.2 oz (65.182 kg)    GENERAL:alert, no distress and comfortable SKIN: skin color, texture, turgor are normal, no rashes or significant lesions EYES: normal, Conjunctiva are pink and non-injected, sclera clear Musculoskeletal:no cyanosis of digits and no clubbing  NEURO: alert & oriented x 3 with fluent speech, no focal motor/sensory deficits  LABORATORY DATA:  I have reviewed the data as listed    Component Value Date/Time   NA 138 10/30/2013 1030   K 3.6* 10/30/2013 1030   CL 101 10/30/2013 1030   CO2 24 10/30/2013 1030   GLUCOSE 102* 10/30/2013 1030   BUN 10 10/30/2013 1030   CREATININE 0.96 10/30/2013 1030   CREATININE 0.98 09/10/2013 1549  CALCIUM 9.3 10/30/2013 1030   PROT 7.3 10/30/2013 1030   ALBUMIN 4.2 10/30/2013 1030   AST 14 10/30/2013 1030   ALT 13 10/30/2013 1030   ALKPHOS 41 10/30/2013 1030   BILITOT 0.6 10/30/2013 1030   GFRNONAA 77* 10/30/2013 1030   GFRNONAA 77 09/10/2013 1549   GFRAA 90* 10/30/2013  1030   GFRAA 88 09/10/2013 1549    No results found for this basename: SPEP,  UPEP,   kappa and lambda light chains    Lab Results  Component Value Date   WBC 4.9 10/30/2013   NEUTROABS 3.1 10/30/2013   HGB 14.3 10/30/2013   HCT 41.3 10/30/2013   MCV 91.2 10/30/2013   PLT 461* 10/30/2013      Chemistry      Component Value Date/Time   NA 138 10/30/2013 1030   K 3.6* 10/30/2013 1030   CL 101 10/30/2013 1030   CO2 24 10/30/2013 1030   BUN 10 10/30/2013 1030   CREATININE 0.96 10/30/2013 1030   CREATININE 0.98 09/10/2013 1549      Component Value Date/Time   CALCIUM 9.3 10/30/2013 1030   ALKPHOS 41 10/30/2013 1030   AST 14 10/30/2013 1030   ALT 13 10/30/2013 1030   BILITOT 0.6 10/30/2013 1030       RADIOGRAPHIC STUDIES: I have reviewed her recent CT angiogram. I have personally reviewed the radiological images as listed and agreed with the findings in the report.  ASSESSMENT & PLAN:  Myeloproliferative neoplasm We discussed the reason of not pursuing bone marrow biopsy as it would not change management. She has been referred to see a specialist in [place] as she will be moving there next week. She will continue aspirin therapy in the meantime.  TIA (transient ischemic attack) Has not recurred. She will continue on aspirin daily.  Palpitations The cause is unknown and she had completed extensive workup in the emergency department recently. I reassured the patient.  All questions were answered. The patient knows to call the clinic with any problems, questions or concerns. No barriers to learning was detected. I spent 15 minutes counseling the patient face to face. The total time spent in the appointment was 20 minutes and more than 50% was on counseling and review of test results     [facility], [place], MD 11/03/2013 8:07 PM

## 2013-11-03 NOTE — Assessment & Plan Note (Signed)
We discussed the reason of not pursuing bone marrow biopsy as it would not change management. She has been referred to see a specialist in [place] as she will be moving there next week. She will continue aspirin therapy in the meantime.

## 2014-10-11 ENCOUNTER — Ambulatory Visit: Payer: BC Managed Care – PPO | Admitting: Hematology and Oncology

## 2014-10-11 ENCOUNTER — Other Ambulatory Visit: Payer: BC Managed Care – PPO

## 2015-02-03 IMAGING — CT CT ANGIO CHEST
2 of 9 series · 19 of 46 positions shown · IV contrast (omnipaque)
Comparison: CT scan of September 03, 2013.

CLINICAL DATA: Chest pain, shortness of breath.

EXAM:
CT ANGIOGRAPHY CHEST WITH CONTRAST
TECHNIQUE: Multidetector CT imaging of the chest was performed using the
standard protocol during bolus administration of intravenous
contrast. Multiplanar CT image reconstructions and MIPs were
obtained to evaluate the vascular anatomy.
CONTRAST:  100mL OMNIPAQUE IOHEXOL 350 MG/ML SOLN

[Series 5: thins · axial · 0.66mm/px · z∈[+1284,+1530]mm · 16 of 278 slices shown]
[im 16/278  lung]
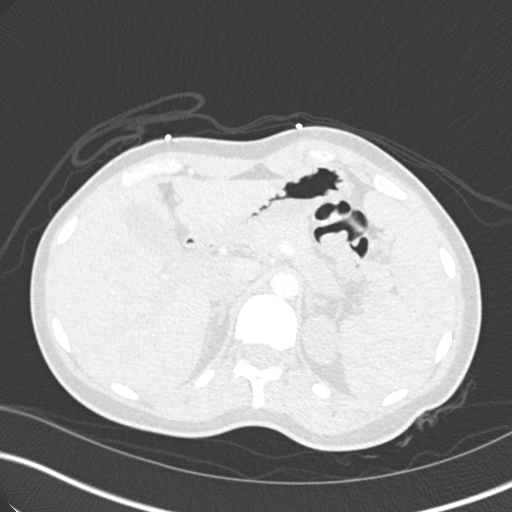
[im 31/278  soft-tissue]
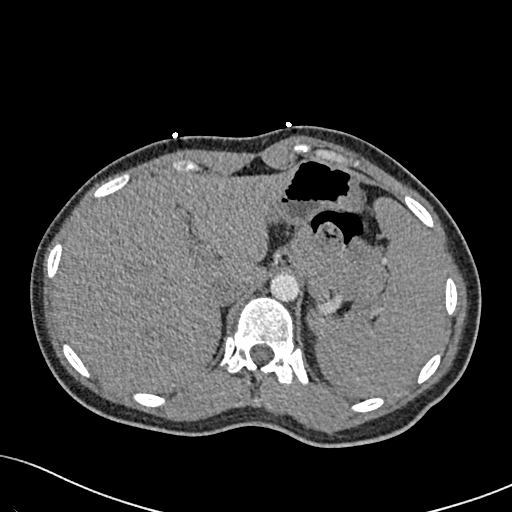
[im 47/278  lung]
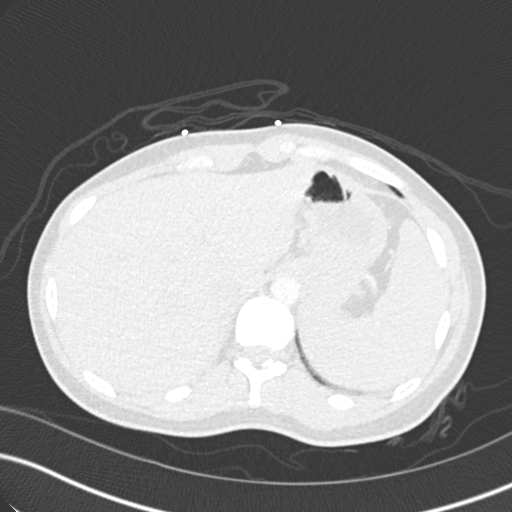
[im 62/278  soft-tissue]
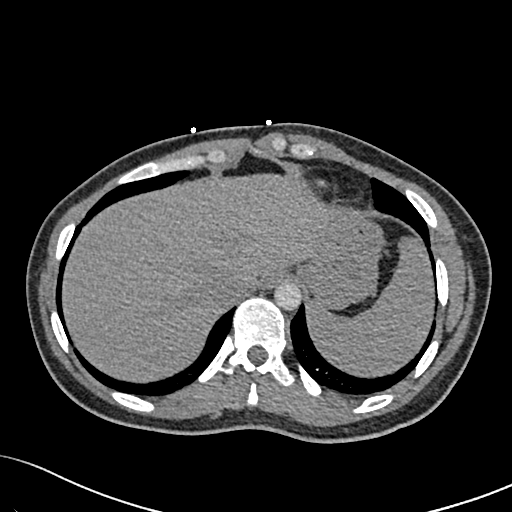
[im 77/278  lung]
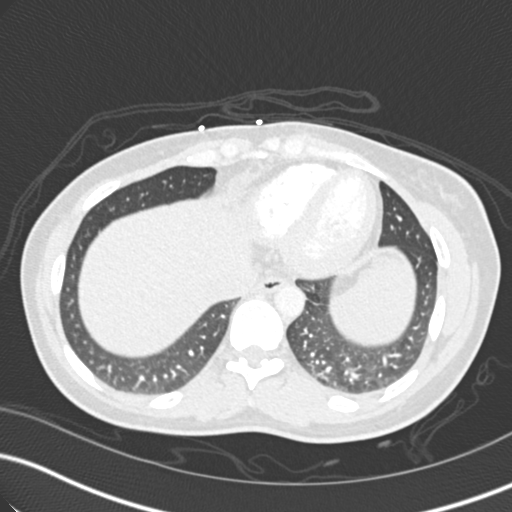
[im 93/278  soft-tissue]
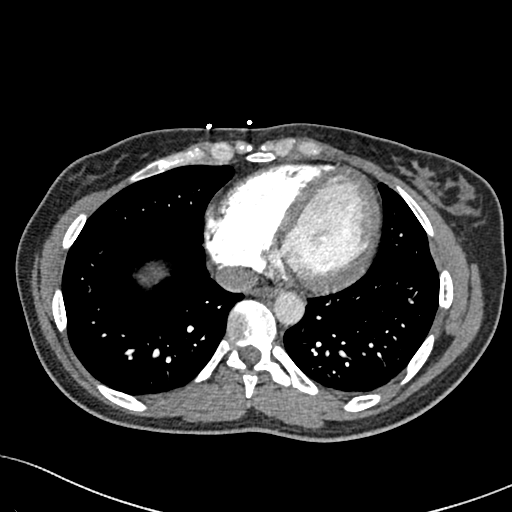
[im 108/278  lung]
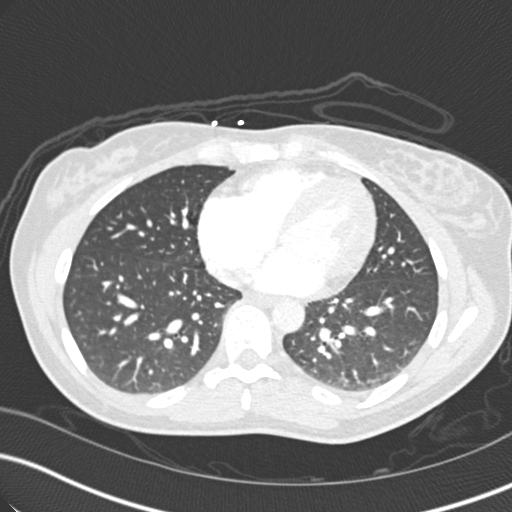
[im 124/278  soft-tissue]
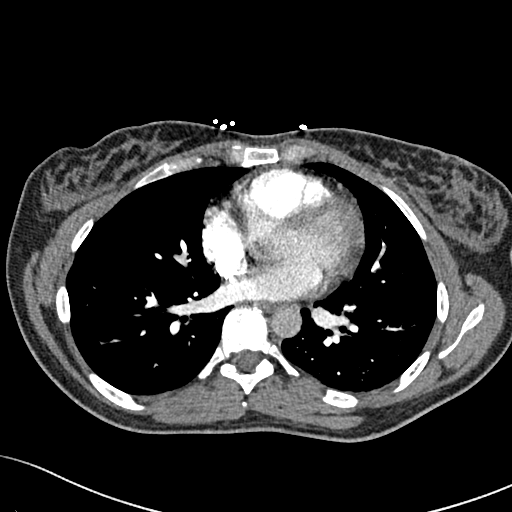
[im 154/278  lung]
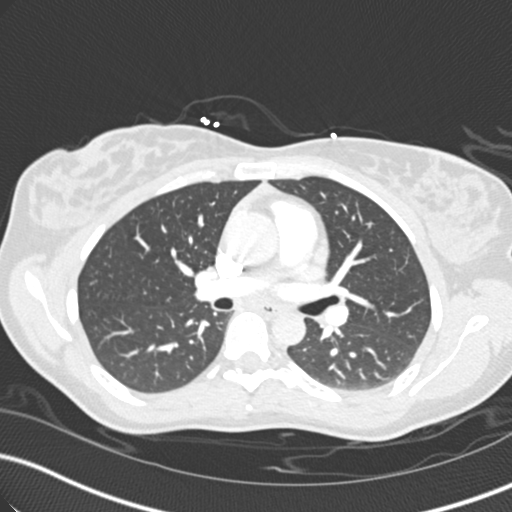
[im 170/278  soft-tissue]
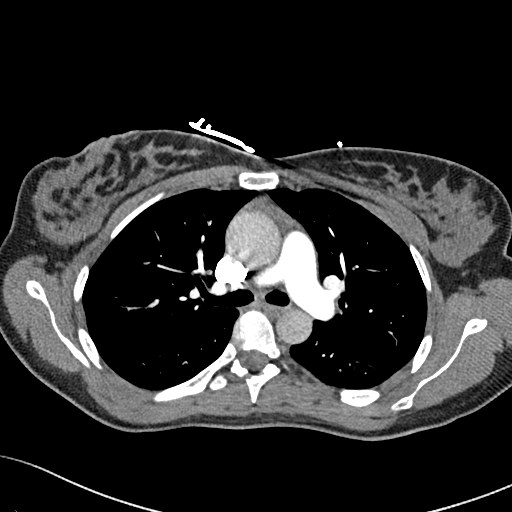
[im 185/278  lung]
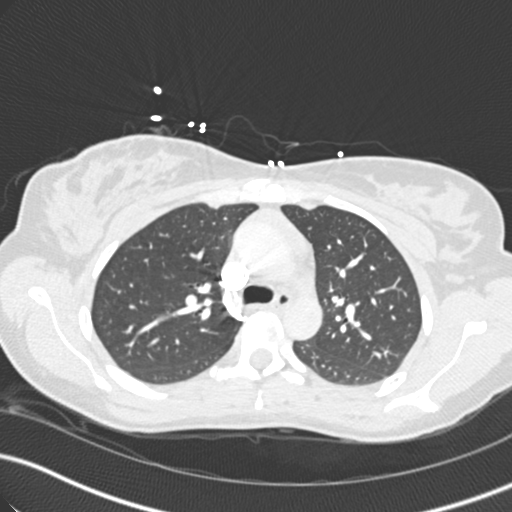
[im 201/278  soft-tissue]
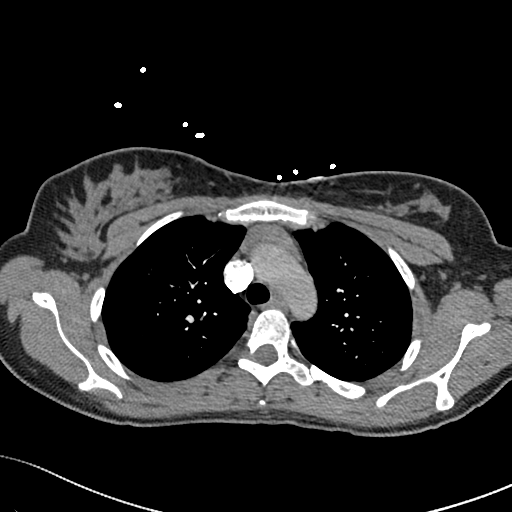
[im 216/278  lung]
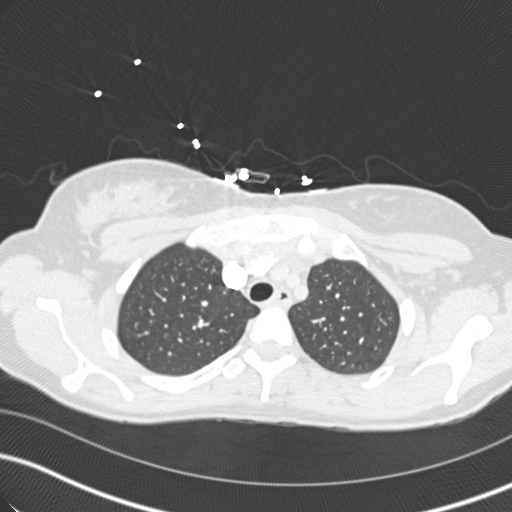
[im 231/278  soft-tissue]
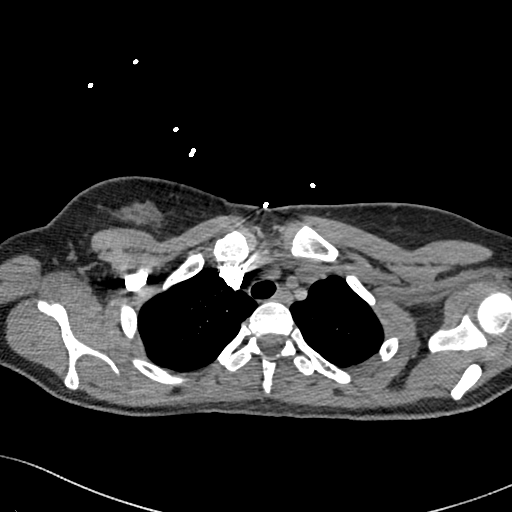
[im 247/278  lung]
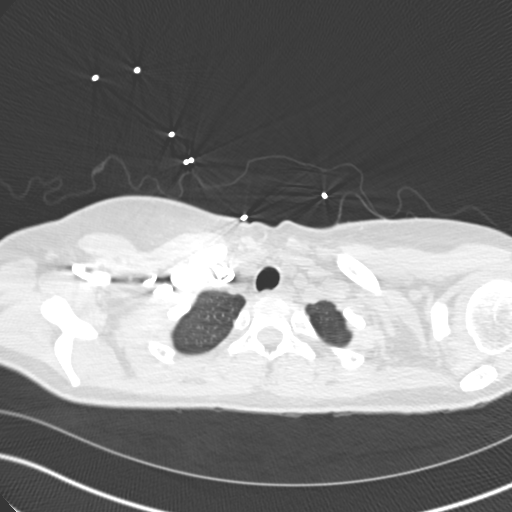
[im 262/278  soft-tissue]
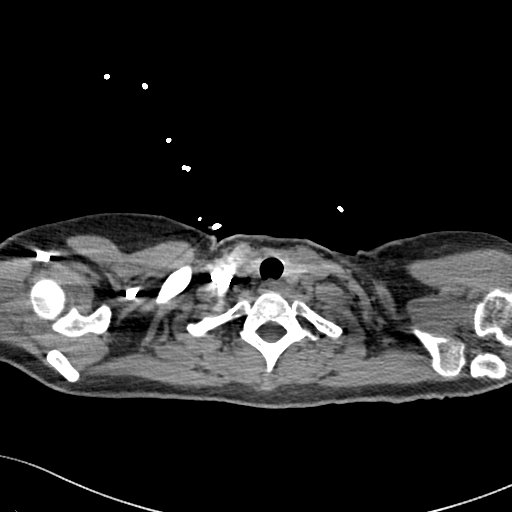

[Series 7: coronal mpr · coronal · 0.59mm/px · 3 of 93 slices shown]
[im 24/93  soft-tissue]
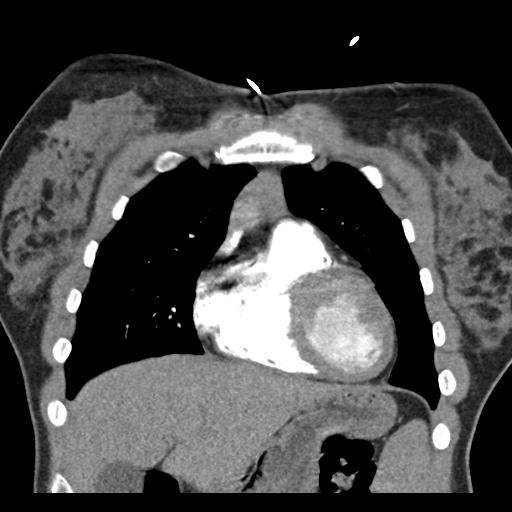
[im 47/93  soft-tissue]
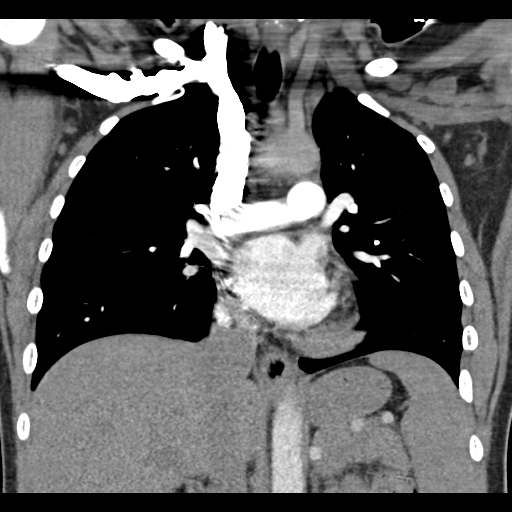
[im 70/93  soft-tissue]
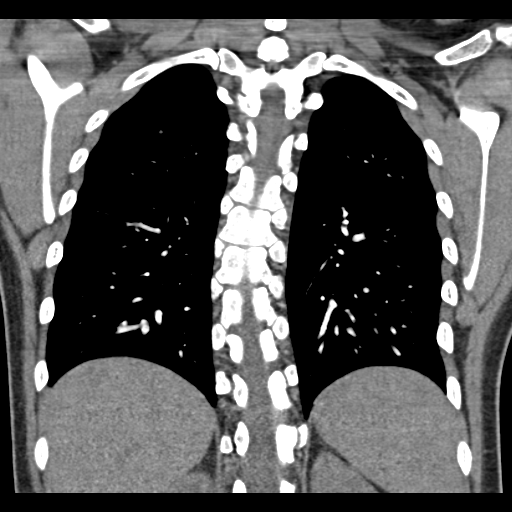

[19 of 46 positions shown; findings below may reference images not displayed]

FINDINGS: No pneumothorax or pleural effusion is noted. No acute pulmonary
disease is noted. Visualized portion of upper abdomen demonstrates
stable hepatic cyst. There is no evidence of pulmonary embolus. No
mediastinal mass or adenopathy is noted. No osseous abnormality is
noted.

Review of the MIP images confirms the above findings.
IMPRESSION: No evidence of pulmonary embolus. No significant abnormality seen in
the chest.

## 2021-01-25 ENCOUNTER — Emergency Department (HOSPITAL_COMMUNITY)
Admission: EM | Admit: 2021-01-25 | Discharge: 2021-01-25 | Disposition: A | Discharge status: Home / Self Care | Payer: BC Managed Care – PPO | Attending: Emergency Medicine | Admitting: Emergency Medicine

## 2021-01-25 ENCOUNTER — Emergency Department (HOSPITAL_COMMUNITY): Payer: BC Managed Care – PPO

## 2021-01-25 ENCOUNTER — Other Ambulatory Visit: Payer: Self-pay

## 2021-01-25 ENCOUNTER — Encounter (HOSPITAL_COMMUNITY): Payer: Self-pay | Admitting: Emergency Medicine

## 2021-01-25 DIAGNOSIS — E349 Endocrine disorder, unspecified: Secondary | ICD-10-CM

## 2021-01-25 DIAGNOSIS — R079 Chest pain, unspecified: Secondary | ICD-10-CM

## 2021-01-25 DIAGNOSIS — N9489 Other specified conditions associated with female genital organs and menstrual cycle: Secondary | ICD-10-CM | POA: Insufficient documentation

## 2021-01-25 DIAGNOSIS — R0789 Other chest pain: Secondary | ICD-10-CM | POA: Insufficient documentation

## 2021-01-25 HISTORY — DX: Polycythemia vera: D45

## 2021-01-25 LAB — CBC
HCT: 38.2 % (ref 36.0–46.0)
Hemoglobin: 13.3 g/dL (ref 12.0–15.0)
MCH: 32.7 pg (ref 26.0–34.0)
MCHC: 34.8 g/dL (ref 30.0–36.0)
MCV: 93.9 fL (ref 80.0–100.0)
Platelets: 236 10*3/uL (ref 150–400)
RBC: 4.07 MIL/uL (ref 3.87–5.11)
RDW: 12.7 % (ref 11.5–15.5)
WBC: 4 10*3/uL (ref 4.0–10.5)
nRBC: 0 % (ref 0.0–0.2)

## 2021-01-25 LAB — COMPREHENSIVE METABOLIC PANEL
ALT: 56 U/L — ABNORMAL HIGH (ref 0–44)
AST: 32 U/L (ref 15–41)
Albumin: 4 g/dL (ref 3.5–5.0)
Alkaline Phosphatase: 35 U/L — ABNORMAL LOW (ref 38–126)
Anion gap: 9 (ref 5–15)
BUN: 12 mg/dL (ref 6–20)
CO2: 24 mmol/L (ref 22–32)
Calcium: 9.4 mg/dL (ref 8.9–10.3)
Chloride: 105 mmol/L (ref 98–111)
Creatinine, Ser: 0.8 mg/dL (ref 0.44–1.00)
GFR, Estimated: >60 mL/min (ref >60)
Glucose, Bld: 94 mg/dL (ref 70–99)
Potassium: 4.1 mmol/L (ref 3.5–5.1)
Sodium: 138 mmol/L (ref 135–145)
Total Bilirubin: 0.8 mg/dL (ref 0.3–1.2)
Total Protein: 6.8 g/dL (ref 6.5–8.1)

## 2021-01-25 LAB — TROPONIN I (HIGH SENSITIVITY)
Troponin I (High Sensitivity): 4 ng/L (ref <18)
Troponin I (High Sensitivity): 3 ng/L (ref <18)

## 2021-01-25 LAB — I-STAT BETA HCG BLOOD, ED (MC, WL, AP ONLY): I-stat hCG, quantitative: 761.3 m[IU]/mL — ABNORMAL HIGH (ref <5)

## 2021-01-25 LAB — HCG, QUANTITATIVE, PREGNANCY: hCG, Beta Chain, Quant, S: 1 m[IU]/mL (ref <5)

## 2021-01-25 LAB — LIPASE, BLOOD: Lipase: 43 U/L (ref 11–51)

## 2021-01-25 MED ORDER — OMEPRAZOLE 20 MG PO CPDR: 20.0000 mg | DELAYED_RELEASE_CAPSULE | Freq: Every day | ORAL | 0 refills | Status: DC

## 2021-01-25 MED ORDER — NAPROXEN 375 MG PO TABS: 375.0000 mg | ORAL_TABLET | Freq: Two times a day (BID) | ORAL | 0 refills | Status: DC

## 2021-01-25 NOTE — ED Provider Notes (Signed)
Emergency Medicine Provider Triage Evaluation Note  Shannon Mcdonald , a 40 y.o. female  was evaluated in triage.  Pt complains of cp radiating to the lue. Also intermittently has luq pain from her polycythemia.  Review of Systems  Positive: Cp and lue pain Negative: nv  Physical Exam  BP (!) 141/101 (BP Location: Right Arm)   Pulse 76   Temp (!) 97.5 F (36.4 C)   Resp 18   Ht 5\' 5"  (1.651 m)   Wt 68 kg   LMP 01/10/2021   SpO2 100%   BMI 24.96 kg/m  Gen:   Awake, no distress   Resp:  Normal effort  MSK:   Moves extremities without difficulty  Other:  No abd ttp, lungs ctab  Medical Decision Making  Medically screening exam initiated at 3:00 PM.  Appropriate orders placed.  Shiloh was informed that the remainder of the evaluation will be completed by another provider, this initial triage assessment does not replace that evaluation, and the importance of remaining in the ED until their evaluation is complete.     Rodney Booze, PA-C 01/25/21 1502    Lorelle Gibbs, DO 01/25/21 1622

## 2021-01-25 NOTE — ED Provider Notes (Signed)
[facility] EMERGENCY DEPARTMENT Provider Note   CSN: [id number] Arrival date & time: 01/25/21  1438     History Chief Complaint  Patient presents with   Chest Pain    Shannon Mcdonald is a 40 y.o. female.   Chest Pain  HPI: A 40 year old patient presents for evaluation of chest pain. Initial onset of pain was approximately 1-3 hours ago. The patient's chest pain is described as heaviness/pressure/tightness and is not worse with exertion. The patient's chest pain is middle- or left-sided, is not well-localized, is not sharp and does radiate to the arms/jaw/neck. The patient does not complain of nausea and denies diaphoresis. The patient has no history of stroke, has no history of peripheral artery disease, has not smoked in the past 90 days, denies any history of treated diabetes, has no relevant family history of coronary artery disease (first degree relative at less than age 43), is not hypertensive, has no history of hypercholesterolemia and does not have an elevated BMI (>=30).   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     Family History  Problem Relation Age of Onset   Hypertension Mother     Social History   Tobacco Use   Smoking status: Never   Smokeless tobacco: Never  Substance Use Topics   Alcohol use: Yes   Drug use: No    Home Medications Prior to Admission medications   Medication Sig Start Date End Date Taking? Authorizing  Provider  naproxen (NAPROSYN) 375 MG tablet Take 1 tablet (375 mg total) by mouth 2 (two) times daily. 01/25/21  Yes Dorie Rank, MD  aspirin EC 81 MG tablet Take 162 mg by mouth 2 (two) times daily.     [provider]  Multiple Vitamin (MULTIVITAMIN) tablet Take 1 tablet by mouth daily.    [provider]  omeprazole (PRILOSEC) 20 MG capsule Take 1 capsule (20 mg total) by mouth daily for 14 days. 01/25/21 02/08/21  Dorie Rank, MD    Allergies    Patient has no known allergies.  Review of Systems   Review of Systems  Cardiovascular:  Positive for chest pain.   Physical Exam Updated Vital Signs BP 129/86   Pulse 75   Temp (!) 97.5 F (36.4 C)   Resp 13   Ht 1.651 m (5\' 5" )   Wt 68 kg   LMP 01/10/2021   SpO2 100%   BMI 24.96 kg/m   Physical Exam  ED Results / Procedures / Treatments   Labs (all labs ordered are listed, but only abnormal results are displayed) Labs Reviewed  COMPREHENSIVE METABOLIC PANEL - Abnormal; Notable for the following components:      Result Value   ALT 56 (*)    Alkaline Phosphatase 35 (*)    All other components within normal limits  I-STAT BETA HCG BLOOD, ED ([facility], [facility], [facility] ONLY) - Abnormal; Notable for the following components:   I-stat  hCG, quantitative 761.3 (*)    All other components within normal limits  CBC  LIPASE, BLOOD  HCG, QUANTITATIVE, PREGNANCY  TROPONIN I (HIGH SENSITIVITY)  TROPONIN I (HIGH SENSITIVITY)    EKG EKG Interpretation  Date/Time:  Wednesday January 25 2021 14:53:55 EST Ventricular Rate:  66 PR Interval:  150 QRS Duration: 84 QT Interval:  392 QTC Calculation: 410 R Axis:   65 Text Interpretation: Normal sinus rhythm Normal ECG No significant change since last tracing Confirmed by Dorie Rank [phone]) on 01/25/2021 7:12:43 PM  Radiology DG Chest 2 View  Result Date: 01/25/2021 CLINICAL DATA:  Chest pain EXAM: CHEST - 2 VIEW COMPARISON:  09/01/2013 FINDINGS: The heart size and mediastinal  contours are within normal limits. Both lungs are clear. The visualized skeletal structures are unremarkable. IMPRESSION: No active cardiopulmonary disease. Electronically Signed   By: Donavan Foil M.D.   On: 01/25/2021 15:53    Procedures Procedures   Medications Ordered in ED Medications - No data to display  ED Course  I have reviewed the triage vital signs and the nursing notes.  Pertinent labs & imaging results that were available during my care of the patient were reviewed by me and considered in my medical decision making (see chart for details).  Clinical Course as of 01/25/21 1933  Wed Jan 25, 2021  1912 Chest x-ray without acute findings [JK]  8416 CBC and metabolic panel unremarkable.  Troponins are normal [JK]    Clinical Course User Index [JK] Dorie Rank, MD   MDM Rules/Calculators/A&P HEAR Score: 1                         Patient presented to the ED for evaluation of chest pain.  Patient overall low risk for heart disease.  Heart score of 1.  Serial troponins are normal.  I doubt acute coronary syndrome.  Findings not suggestive of pericarditis.  Patient is not having any shortness of breath.  Doubt pulmonary embolism.  PERC negative  No pneumonia or pneumothorax on x-ray.  Etiology unclear but work-up is reassuring.  We will try course of antacids and NSAIDs and recommend outpatient follow-up with primary care doctor or cardiologist to discuss further testing such as echocardiogram.  Incidentally patient's hCG was elevated.  Patient states there is no way she could be pregnant.  She has not had intercourse.  Recommend repeat testing.  Discussed outpatient follow-up with her PCP or consider seeing an OB/GYN. Final Clinical Impression(s) / ED Diagnoses Final diagnoses:  Chest pain with low risk for cardiac etiology  Elevated serum hCG    Rx / DC Orders ED Discharge Orders          Ordered    omeprazole (PRILOSEC) 20 MG capsule  Daily        01/25/21 1930     naproxen (NAPROSYN) 375 MG tablet  2 times daily        01/25/21 1930             Dorie Rank, MD 01/25/21 1933

## 2021-01-25 NOTE — Discharge Instructions (Signed)
Try taking the antacid medication anti-inflammatory to see if that helps with pain.  Follow-up with your primary care doctor or cardiologist for further evaluation  As we discussed the pregnancy test, hcg, was elevated.  Follow-up with your doctor to have that rechecked.

## 2021-01-25 NOTE — ED Triage Notes (Signed)
Patient c/o upper left sided chest pain and discomfort onset of yesterday. Reports a shooting sensation into her left arm. Shortness of breath and lightheadedness associated with chest discomfort.

## 2021-02-02 NOTE — Progress Notes (Signed)
Cardiology Office Note:    Date:  02/03/2021   ID:  Shannon Mcdonald, DOB 18-Sep-1980, MRN [id number]  PCP:  Patient, No Pcp Per (Inactive)  Cardiologist:  None  Electrophysiologist:  None   Referring MD: Dorie Rank, MD   Chief Complaint/Reason for Referral: Chest pain  History of Present Illness:    Shannon Mcdonald is a 40 y.o. female with the indicated history here for ER follow-up.  The patient was recently seen in the emergency department for acute onset chest pain.  Her troponins and EKG were unremarkable.  There was low suspicion for pulmonary embolism.  She was discharged home.  The patient tells me she has had fluctuating chest pain over the last few weeks.  It is a central chest pressure with some radiation down her left arm.  Interestingly does not occur much when she exerts herself.  She has also had some nocturnal shortness of breath.  She finds that she has had poor sleep and she does snore.  She wakes up in the melanite short of breath at times.  She denies any palpitations, paroxysmal nocturnal dyspnea, orthopnea, or peripheral edema.  She does have some neuropathic symptoms in her fingertips and her toes.  She is seeing hematologist for her history of polycythemia vera and has a spleen ultrasound pending.  Past Medical History:  Diagnosis Date   Allergy    Altitude sickness    GERD (gastroesophageal reflux disease)    Polycythemia vera ([place])    Thrombocytosis 09/29/2013   TIA (transient ischemic attack) 09/29/2013  Polycythemia vera (followed by [facility] hematology; managed with periodic phlebotomy)  Past Surgical History:  Procedure Laterality Date   NASAL SINUS SURGERY      Current Medications: Current Meds  Medication Sig   aspirin EC 81 MG tablet Take 162 mg by mouth 2 (two) times daily.    magnesium oxide (MAG-OX) 400 MG tablet Take by mouth.   Multiple Vitamin (MULTIVITAMIN) tablet Take 1 tablet by mouth daily.   peginterferon alfa-2a (PEGASYS) 180  MCG/ML injection Inject 180 mcg into the skin every 7 (seven) days. Per patient taking 45 mg every 14 days   Prenatal MV-Min-Fe Fum-FA-DHA (PRENATAL+DHA) 28-0.975 & 200 MG MISC Take 1 tablet by mouth daily.   traZODone (DESYREL) 25 mg TABS tablet    valACYclovir (VALTREX) 500 MG tablet Take by mouth.     Allergies:   No Known Allergies  Social History   Tobacco Use   Smoking status: Never   Smokeless tobacco: Never  Substance Use Topics   Alcohol use: Yes   Drug use: No     Family History: Family History  Problem Relation Age of Onset   Hypertension Mother      ROS:   Please see the history of present illness.    All other systems reviewed and are negative.  EKGs/Labs/Other Studies Reviewed:    The following studies were reviewed today:  EKG: 1123 ED EKG demonstrates normal sinus rhythm with no ischemic changes  Imaging studies that I have independently reviewed today:   CXR No active cardiopulmonary disease. 01/25/21  Recent Labs: 01/25/2021: ALT 56; BUN 12; Creatinine, Ser 0.80; Hemoglobin 13.3; Platelets 236; Potassium 4.1; Sodium 138  Recent Lipid Panel No results found for: CHOL, TRIG, HDL, CHOLHDL, VLDL, LDLCALC, LDLDIRECT  Physical Exam:    VS:  BP 118/80   Pulse 67   Ht 5\' 5"  (1.651 m)   Wt 155 lb 6.4 oz (70.5 kg)   LMP  01/10/2021   SpO2 99%   BMI 25.86 kg/m     Wt Readings from Last 5 Encounters:  02/03/21 155 lb 6.4 oz (70.5 kg)  01/25/21 150 lb (68 kg)  11/03/13 143 lb 11.2 oz (65.2 kg)  10/30/13 140 lb (63.5 kg)  10/09/13 146 lb 8 oz (66.5 kg)    GENERAL:  No apparent distress, AOx3 HEENT:  No carotid bruits, +2 carotid impulses, no scleral icterus CAR: RRR no murmurs, gallops, rubs, or thrills RES:  Clear to auscultation bilaterally ABD:  Soft, nontender, nondistended, positive bowel sounds x 4 VASC:  +2 radial pulses, +2 carotid pulses, palpable pedal pulses NEURO:  CN 2-12 grossly intact; motor and sensory grossly intact PSYCH:  No  active depression or anxiety EXT:  No edema, ecchymosis, or cyanosis  ASSESSMENT:    1. Precordial pain   2. Snoring    PLAN:    Precordial pain Will obtain a CTA and echocardiogram to evaluate further.  Will keep follow up with me open-ended based on these results.  Snoring Will obtain OSA evaluation    Shared Decision Making/Informed Consent:       Medication Adjustments/Labs and Tests Ordered: Current medicines are reviewed at length with the patient today.  Concerns regarding medicines are outlined above.   Orders Placed This Encounter  Procedures   CT CORONARY MORPH W/CTA COR W/SCORE W/CA W/CM &/OR WO/CM   ECHOCARDIOGRAM COMPLETE   Split night study     No orders of the defined types were placed in this encounter.   There are no Patient Instructions on file for this visit.

## 2021-02-03 ENCOUNTER — Encounter: Payer: Self-pay | Admitting: Internal Medicine

## 2021-02-03 ENCOUNTER — Ambulatory Visit (INDEPENDENT_AMBULATORY_CARE_PROVIDER_SITE_OTHER): Payer: BC Managed Care – PPO | Admitting: Internal Medicine

## 2021-02-03 ENCOUNTER — Other Ambulatory Visit: Payer: Self-pay

## 2021-02-03 VITALS — BP 118/80 | HR 67 | Ht 65.0 in | Wt 155.4 lb

## 2021-02-03 DIAGNOSIS — R0683 Snoring: Secondary | ICD-10-CM | POA: Diagnosis not present

## 2021-02-03 DIAGNOSIS — R072 Precordial pain: Secondary | ICD-10-CM | POA: Diagnosis not present

## 2021-02-03 MED ORDER — METOPROLOL TARTRATE 100 MG PO TABS: ORAL_TABLET | ORAL | 0 refills | Status: DC

## 2021-02-03 NOTE — Patient Instructions (Addendum)
Medication Instructions:  Your physician recommends that you continue on your current medications as directed. Please refer to the Current Medication list given to you today.  *If you need a refill on your cardiac medications before your next appointment, please call your pharmacy*   Lab Work: None  If you have labs (blood work) drawn today and your tests are completely normal, you will receive your results only by: [place] (if you have MyChart) OR A paper copy in the mail If you have any lab test that is abnormal or we need to change your treatment, we will call you to review the results.   Testing/Procedures: Your physician has requested that you have an echocardiogram. Echocardiography is a painless test that uses sound waves to create images of your heart. It provides your doctor with information about the size and shape of your heart and how well your heart's chambers and valves are working. This procedure takes approximately one hour. There are no restrictions for this procedure.  Your physician recommends that you have a Coronary CT performed.  See next page for instructions.  Your physician has recommended that you have a sleep study. This test records several body functions during sleep, including: brain activity, eye movement, oxygen and carbon dioxide blood levels, heart rate and rhythm, breathing rate and rhythm, the flow of air through your mouth and nose, snoring, body muscle movements, and chest and belly movement.   Follow-Up: At [facility], you and your health needs are our priority.  As part of our continuing mission to provide you with exceptional heart care, we have created designated Provider Care Teams.  These Care Teams include your primary Cardiologist (physician) and Advanced Practice Providers (APPs -  Physician Assistants and Nurse Practitioners) who all work together to provide you with the care you need, when you need it.  We recommend signing up for  the patient portal called "MyChart".  Sign up information is provided on this After Visit Summary.  MyChart is used to connect with patients for Virtual Visits (Telemedicine).  Patients are able to view lab/test results, encounter notes, upcoming appointments, etc.  Non-urgent messages can be sent to your provider as well.   To learn more about what you can do with MyChart, go to [email or web address].    Your next appointment:   As needed  The format for your next appointment:   In Person  Provider:   Lenna Sciara, MD    Other Instructions    Your cardiac CT will be scheduled at one of the below locations:   [facility] [address] [place], [place] [address] [phone]  [place] [address] [place], [place] [address] [phone]  If scheduled at [facility], please arrive at the [facility] main entrance (entrance A) of [facility] 30 minutes prior to test start time. You can use the FREE valet parking offered at the main entrance (encouraged to control the heart rate for the test) Proceed to the [facility] Radiology Department (first floor) to check-in and test prep.  If scheduled at [facility], please arrive 15 mins early for check-in and test prep.  Please follow these instructions carefully (unless otherwise directed):  Hold all erectile dysfunction medications at least 3 days (72 hrs) prior to test.  On the Night Before the Test: Be sure to Drink plenty of water. Do not consume any caffeinated/decaffeinated beverages or chocolate 12 hours  prior to your test. Do not take any antihistamines 12 hours prior to your test.  On the Day of the Test: Drink plenty of water until 1 hour prior to the test. Do not eat any food 4 hours prior to the test. You may take your regular medications prior to the test.  Take metoprolol (Lopressor) two hours prior  to test. HOLD Furosemide/Hydrochlorothiazide morning of the test. FEMALES- please wear underwire-free bra if available, avoid dresses & tight clothing       After the Test: Drink plenty of water. After receiving IV contrast, you may experience a mild flushed feeling. This is normal. On occasion, you may experience a mild rash up to 24 hours after the test. This is not dangerous. If this occurs, you can take Benadryl 25 mg and increase your fluid intake. If you experience trouble breathing, this can be serious. If it is severe call 911 IMMEDIATELY. If it is mild, please call our office. If you take any of these medications: Glipizide/Metformin, Avandament, Glucavance, please do not take 48 hours after completing test unless otherwise instructed.  Please allow 2-4 weeks for scheduling of routine cardiac CTs. Some insurance companies require a pre-authorization which may delay scheduling of this test.   For non-scheduling related questions, please contact the cardiac imaging nurse navigator should you have any questions/concerns: Marchia Bond, Cardiac Imaging Nurse Navigator Gordy Clement, Cardiac Imaging Nurse Navigator [facility] Direct Office Dial: [phone]   For scheduling needs, including cancellations and rescheduling, please call [place], [phone].

## 2021-02-16 ENCOUNTER — Telehealth (HOSPITAL_COMMUNITY): Payer: Self-pay | Admitting: Emergency Medicine

## 2021-02-16 NOTE — Telephone Encounter (Signed)
Attempted to call patient regarding upcoming cardiac CT appointment. °Left message on voicemail with name and callback number °Meital Riehl RN Navigator Cardiac Imaging °[facility] °[phone] Office °[phone] Cell ° °

## 2021-02-17 ENCOUNTER — Ambulatory Visit (HOSPITAL_COMMUNITY)
Admission: RE | Admit: 2021-02-17 | Discharge: 2021-02-17 | Disposition: A | Discharge status: Home / Self Care | Payer: BC Managed Care – PPO | Source: Ambulatory Visit | Attending: Internal Medicine | Admitting: Internal Medicine

## 2021-02-17 ENCOUNTER — Other Ambulatory Visit: Payer: Self-pay

## 2021-02-17 DIAGNOSIS — R072 Precordial pain: Secondary | ICD-10-CM | POA: Insufficient documentation

## 2021-02-17 MED ORDER — IOHEXOL 350 MG/ML SOLN
100.0000 mL | Freq: Once | INTRAVENOUS | Status: AC | PRN
  Administered 2021-02-17: 100 mL via INTRAVENOUS

## 2021-02-17 MED ORDER — NITROGLYCERIN 0.4 MG SL SUBL
0.8000 mg | SUBLINGUAL_TABLET | Freq: Once | SUBLINGUAL | Status: AC
  Administered 2021-02-17: 0.8 mg via SUBLINGUAL

## 2021-02-17 MED ORDER — NITROGLYCERIN 0.4 MG SL SUBL
SUBLINGUAL_TABLET | SUBLINGUAL | Status: AC
  Filled 2021-02-17: qty 2

## 2021-02-21 ENCOUNTER — Ambulatory Visit (HOSPITAL_COMMUNITY): Payer: BC Managed Care – PPO | Attending: Cardiology

## 2021-02-21 ENCOUNTER — Other Ambulatory Visit: Payer: Self-pay

## 2021-02-21 DIAGNOSIS — R072 Precordial pain: Secondary | ICD-10-CM | POA: Insufficient documentation

## 2021-02-21 LAB — ECHOCARDIOGRAM COMPLETE
Area-P 1/2: 4.31 cm2
S' Lateral: 3.2 cm

## 2021-02-28 ENCOUNTER — Encounter: Payer: Self-pay | Admitting: Internal Medicine

## 2021-04-05 ENCOUNTER — Telehealth: Payer: Self-pay | Admitting: *Deleted

## 2021-04-05 DIAGNOSIS — R0683 Snoring: Secondary | ICD-10-CM

## 2021-04-05 DIAGNOSIS — R002 Palpitations: Secondary | ICD-10-CM

## 2021-04-05 NOTE — Telephone Encounter (Signed)
Gus Puma at [facility] does not meet clinical review can do a Peer to Peer at [phone] Pending PA [phone]

## 2021-04-07 ENCOUNTER — Encounter (HOSPITAL_BASED_OUTPATIENT_CLINIC_OR_DEPARTMENT_OTHER): Payer: BC Managed Care – PPO | Admitting: Cardiology

## 2021-04-14 NOTE — Telephone Encounter (Signed)
Approved for HST but patient has declined the test at this time

## 2021-04-24 NOTE — Telephone Encounter (Signed)
Patient changed her mind and wants the sleep study. Per Pam at [facility] previous case closed and had to start another request. Peer to Peer required or send clinicals. Will send clinicals to Nurse Reviewer @ [phone].

## 2021-05-15 ENCOUNTER — Encounter (HOSPITAL_BASED_OUTPATIENT_CLINIC_OR_DEPARTMENT_OTHER): Payer: BC Managed Care – PPO | Admitting: Cardiology

## 2022-02-01 ENCOUNTER — Other Ambulatory Visit: Payer: Self-pay

## 2022-02-01 ENCOUNTER — Encounter (HOSPITAL_COMMUNITY): Payer: Self-pay | Admitting: Obstetrics & Gynecology

## 2022-02-01 ENCOUNTER — Inpatient Hospital Stay (HOSPITAL_COMMUNITY)
Admission: AD | Admit: 2022-02-01 | Discharge: 2022-02-02 | Disposition: A | Discharge status: Home / Self Care | Payer: BC Managed Care – PPO | Attending: Obstetrics & Gynecology | Admitting: Obstetrics & Gynecology

## 2022-02-01 DIAGNOSIS — O165 Unspecified maternal hypertension, complicating the puerperium: Secondary | ICD-10-CM | POA: Diagnosis present

## 2022-02-01 DIAGNOSIS — I10 Essential (primary) hypertension: Secondary | ICD-10-CM | POA: Diagnosis not present

## 2022-02-01 DIAGNOSIS — G4489 Other headache syndrome: Secondary | ICD-10-CM | POA: Diagnosis not present

## 2022-02-01 LAB — URINALYSIS, ROUTINE W REFLEX MICROSCOPIC
Bilirubin Urine: NEGATIVE
Glucose, UA: NEGATIVE mg/dL
Hgb urine dipstick: NEGATIVE
Ketones, ur: NEGATIVE mg/dL
Leukocytes,Ua: NEGATIVE
Nitrite: NEGATIVE
Protein, ur: NEGATIVE mg/dL
Specific Gravity, Urine: 1.01 (ref 1.005–1.030)
pH: 6 (ref 5.0–8.0)

## 2022-02-01 MED ORDER — LACTATED RINGERS IV SOLN: INTRAVENOUS | Status: DC

## 2022-02-01 MED ORDER — MAGNESIUM SULFATE BOLUS VIA INFUSION
4.0000 g | Freq: Once | INTRAVENOUS | Status: DC
  Filled 2022-02-01: qty 1000

## 2022-02-01 MED ORDER — MAGNESIUM SULFATE 40 GM/1000ML IV SOLN: 2.0000 g/h | INTRAVENOUS | Status: DC

## 2022-02-01 MED ORDER — LABETALOL HCL 5 MG/ML IV SOLN: 40.0000 mg | INTRAVENOUS | Status: DC | PRN

## 2022-02-01 MED ORDER — NIFEDIPINE 10 MG PO CAPS: 10.0000 mg | ORAL_CAPSULE | ORAL | Status: DC | PRN

## 2022-02-01 MED ORDER — NIFEDIPINE 10 MG PO CAPS: 20.0000 mg | ORAL_CAPSULE | ORAL | Status: DC | PRN

## 2022-02-01 NOTE — ED Provider Triage Note (Signed)
Emergency Medicine Provider OB Triage Evaluation Note  Shannon Mcdonald is a 41 y.o. female, No obstetric history on file., at Unknown gestation who presents to the emergency department with complaints of headache, dizziness, and bilateral hand tingling.  Review of  Systems  Positive: headache Negative: weakness  Physical Exam  BP (!) 161/104   Pulse 72   Temp 98 F (36.7 C)   Resp 16   Ht '5\' 5"'$  (1.651 m)   Wt 77.1 kg   SpO2 98%   BMI 28.29 kg/m  General: Awake, no distress  HEENT: Atraumatic  Resp: Normal effort  Cardiac: Normal rate Abd: Nondistended, nontender  MSK: Moves all extremities without difficulty Neuro: Speech clear  Medical Decision Making  Pt evaluated for pregnancy concern and is stable for transfer to [facility]U. Pt is in agreement with plan for transfer.  10:54 PM Discussed with MAU APP, Lelan Pons, who accepts patient in transfer.  Clinical Impression  Post partum hypertension     Montine Circle, PA-C 02/01/22 2255

## 2022-02-01 NOTE — MAU Note (Signed)
Sent from ED for Elevated B/p and headache. B/P at home was 152/112. Called OB and told to come in. PP c/section 01/23/22 for gestational HTN.  Headache was 5-6 now 2. Still has some tingling on right side of face.

## 2022-02-01 NOTE — ED Triage Notes (Addendum)
Arrives POV from home. Complains of HA and HTN-BP 160 sys at home-right sided face numbness and in both hands '@1800'$ -has had this in the past. Took tylenol-had some relief- still complains of numbness.1 week postpartum-had gestational HTN. Has polycythemia vera-managed with medication. On lovenox for 6 mo postpartum. Also reports C-section with bladder injury- had foley catheter up until yesterday- has been urinating without issue.

## 2022-02-02 DIAGNOSIS — G4489 Other headache syndrome: Secondary | ICD-10-CM

## 2022-02-02 DIAGNOSIS — I10 Essential (primary) hypertension: Secondary | ICD-10-CM

## 2022-02-02 LAB — COMPREHENSIVE METABOLIC PANEL
ALT: 22 U/L (ref 0–44)
AST: 16 U/L (ref 15–41)
Albumin: 3 g/dL — ABNORMAL LOW (ref 3.5–5.0)
Alkaline Phosphatase: 70 U/L (ref 38–126)
Anion gap: 10 (ref 5–15)
BUN: 20 mg/dL (ref 6–20)
CO2: 20 mmol/L — ABNORMAL LOW (ref 22–32)
Calcium: 8.6 mg/dL — ABNORMAL LOW (ref 8.9–10.3)
Chloride: 107 mmol/L (ref 98–111)
Creatinine, Ser: 0.92 mg/dL (ref 0.44–1.00)
GFR, Estimated: >60 mL/min (ref >60)
Glucose, Bld: 101 mg/dL — ABNORMAL HIGH (ref 70–99)
Potassium: 4 mmol/L (ref 3.5–5.1)
Sodium: 137 mmol/L (ref 135–145)
Total Bilirubin: 0.5 mg/dL (ref 0.3–1.2)
Total Protein: 6.5 g/dL (ref 6.5–8.1)

## 2022-02-02 LAB — CBC
HCT: 32.8 % — ABNORMAL LOW (ref 36.0–46.0)
Hemoglobin: 11 g/dL — ABNORMAL LOW (ref 12.0–15.0)
MCH: 32 pg (ref 26.0–34.0)
MCHC: 33.5 g/dL (ref 30.0–36.0)
MCV: 95.3 fL (ref 80.0–100.0)
Platelets: 392 10*3/uL (ref 150–400)
RBC: 3.44 MIL/uL — ABNORMAL LOW (ref 3.87–5.11)
RDW: 13.2 % (ref 11.5–15.5)
WBC: 5.5 10*3/uL (ref 4.0–10.5)
nRBC: 0 % (ref 0.0–0.2)

## 2022-02-02 MED ORDER — FUROSEMIDE 20 MG PO TABS
20.0000 mg | ORAL_TABLET | Freq: Two times a day (BID) | ORAL | Status: DC
  Filled 2022-02-02: qty 1

## 2022-02-02 MED ORDER — NIFEDIPINE ER 30 MG PO TB24: 30.0000 mg | ORAL_TABLET | Freq: Every day | ORAL | 0 refills | Status: DC

## 2022-02-02 MED ORDER — NIFEDIPINE ER OSMOTIC RELEASE 30 MG PO TB24: 30.0000 mg | ORAL_TABLET | Freq: Every day | ORAL | Status: DC

## 2022-02-02 NOTE — Progress Notes (Signed)
Dr. Harolyn Rutherford notified of pt's lab results and blood pressure readings. MD asked RN to consult with the pt about being discharged with a prescription for blood pressure medication.   RN entered pt room and told pt MD's recommendation of a prescription of blood pressure medications to go home on. Pt acceptable of plan and stated she will follow up with her OB.   0226 MD notified. New orders placed.

## 2022-02-02 NOTE — MAU Provider Note (Signed)
Faculty Practice OB/GYN Attending MAU Note  Chief Complaint: Headache and Numbness    Event Date/Time   First Provider Initiated Contact with Patient 02/02/22 0104      SUBJECTIVE Shannon Mcdonald is a 41 y.o. [id number] s/p RLTCS on 01/23/22 in the setting of GHTN, done at [facility].  History of polycythemia vera, followed by [facility] specialists.  She was not discharged on any antihypertensive medications. She last had a follow up visit on 01/31/22, BP was 133/90 and the plan was for continued close home monitoring. Earlier in the day, she had a BP of 152/112, sever headache and right sided facial numbness ( this is a usual symptom she gets with headches). No slurred speech, no extremity weakness. She called her OB practice and she was advised to come to nearest ED which was [facility] ED. In the [facility] ED, her BP was noted to be  161/104.  She was sent to [facility] MAU for further evaluation.  On arrival here, she reports decreased severity of headache, and mild tingling on right side of face. Patient denied any headaches, visual symptoms, RUQ/epigastric pain or other concerning symptoms. Denies any  fevers, chills, sweats, dysuria, nausea, vomiting, other GI or GU symptoms or other general symptoms.  Past Medical History:  Diagnosis Date   Allergy    Altitude sickness    GERD (gastroesophageal reflux disease)    Polycythemia vera (HCC)    Thrombocytosis 09/29/2013   TIA (transient ischemic attack) 09/29/2013   OB History  Gravida Para Term Preterm AB Living  '2 2 2     2  '$ SAB IAB Ectopic Multiple Live Births          2    # Outcome Date GA Lbr Len/2nd Weight Sex Delivery Anes PTL Lv  2 Term 01/23/22    F CS-LTranv   LIV  1 Term 09/25/18    M CS-LTranv   LIV   Past Surgical History:  Procedure Laterality Date   CESAREAN SECTION     NASAL SINUS SURGERY     Social History   Socioeconomic History   Marital status: Married    Spouse name: Not on file   Number of children: Not on file   Years of  education: Not on file   Highest education level: Not on file  Occupational History   Not on file  Tobacco Use   Smoking status: Never   Smokeless tobacco: Never  Vaping Use   Vaping Use: Never used  Substance and Sexual Activity   Alcohol use: Not Currently   Drug use: No   Sexual activity: Never    Birth control/protection: Abstinence  Other Topics Concern   Not on file  Social History Narrative   Not on file   Social Determinants of Health   Financial Resource Strain: Not on file  Food Insecurity: Not on file  Transportation Needs: Not on file  Physical Activity: Not on file  Stress: Not on file  Social Connections: Not on file  Intimate Partner Violence: Not on file   No current facility-administered medications on file prior to encounter.   Current Outpatient Medications on File Prior to Encounter  Medication Sig Dispense Refill   enoxaparin (LOVENOX) 40 MG/0.4ML injection Inject 40 mg into the skin daily.     peginterferon alfa-2a (PEGASYS) 180 MCG/ML injection Inject 180 mcg into the skin every 7 (seven) days. Per patient taking 45 mg every 14 days     Prenatal MV-Min-Fe Fum-FA-DHA (PRENATAL+DHA) 28-0.975 &  200 MG MISC Take 1 tablet by mouth daily.     aspirin EC 81 MG tablet Take 162 mg by mouth 2 (two) times daily.  (Patient not taking: Reported on 02/01/2022)     magnesium oxide (MAG-OX) 400 MG tablet Take by mouth. (Patient not taking: Reported on 02/01/2022)     metoprolol tartrate (LOPRESSOR) 100 MG tablet Take one tablet by mouth 2 hours prior to CT (Patient not taking: Reported on 02/01/2022) 1 tablet 0   Multiple Vitamin (MULTIVITAMIN) tablet Take 1 tablet by mouth daily.     naproxen (NAPROSYN) 375 MG tablet Take 1 tablet (375 mg total) by mouth 2 (two) times daily. (Patient not taking: Reported on 02/03/2021) 20 tablet 0   omeprazole (PRILOSEC) 20 MG capsule Take 1 capsule (20 mg total) by mouth daily for 14 days. (Patient not taking: Reported on 02/03/2021)  14 capsule 0   traZODone (DESYREL) 25 mg TABS tablet  (Patient not taking: Reported on 02/01/2022)     valACYclovir (VALTREX) 500 MG tablet Take by mouth. (Patient not taking: Reported on 02/01/2022)     No Known Allergies  ROS: Pertinent items in HPI  OBJECTIVE  Patient Vitals for the past 24 hrs:  BP Temp Pulse Resp SpO2 Height Weight  02/02/22 0215 (!) 131/90 -- (!) 56 -- -- -- --  02/02/22 0200 (!) 115/94 -- 65 -- -- -- --  02/02/22 0145 (!) 137/93 -- 66 -- -- -- --  02/02/22 0130 130/87 -- (!) 55 -- -- -- --  02/02/22 0115 132/81 -- 65 -- -- -- --  02/02/22 0100 112/76 -- 61 -- -- -- --  02/02/22 0047 115/77 -- (!) 54 -- -- -- --  02/02/22 0003 122/80 -- (!) 58 -- -- -- --  02/01/22 2345 128/83 -- 61 -- 99 % -- --  02/01/22 2331 132/82 -- 70 -- -- -- --  02/01/22 2211 -- -- -- -- -- '5\' 5"'$  (1.651 m) 77.1 kg  02/01/22 2206 (!) 161/104 98 F (36.7 C) 72 16 98 % -- --    CONSTITUTIONAL: Well-developed, well-nourished female in no acute distress.  HENT:  Normocephalic, atraumatic, External right and left ear normal. Oropharynx is clear and moist EYES: Conjunctivae and EOM are normal. Pupils are equal, round, and reactive to light. No scleral icterus.  NECK: Normal range of motion, supple, no masses.  Normal thyroid.  SKIN: Skin is warm and dry. No rash noted. Not diaphoretic. No erythema. No pallor. [place]: Alert and oriented to person, place, and time. Normal reflexes, muscle tone coordination. No cranial nerve deficit noted. PSYCHIATRIC: Normal mood and affect. Normal behavior. Normal judgment and thought content. CARDIOVASCULAR: Normal heart rate noted RESPIRATORY: Effort and breath sounds normal, no problems with respiration noted. ABDOMEN: Soft, no distention noted.  No tenderness, rebound or guarding.  PELVIC: Deferred. MUSCULOSKELETAL: Normal range of motion. No tenderness.  No cyanosis, clubbing, or edema.  2+ distal pulses.  LAB RESULTS Results for orders placed  or performed during the hospital encounter of 02/01/22 (from the past 48 hour(s))  Urinalysis, Routine w reflex microscopic     Status: Abnormal   Collection Time: 02/01/22 11:32 PM  Result Value Ref Range   Color, Urine STRAW (A) YELLOW   APPearance CLEAR CLEAR   Specific Gravity, Urine 1.010 1.005 - 1.030   pH 6.0 5.0 - 8.0   Glucose, UA NEGATIVE NEGATIVE mg/dL   Hgb urine dipstick NEGATIVE NEGATIVE   Bilirubin Urine NEGATIVE NEGATIVE   Ketones,  ur NEGATIVE NEGATIVE mg/dL   Protein, ur NEGATIVE NEGATIVE mg/dL   Nitrite NEGATIVE NEGATIVE   Leukocytes,Ua NEGATIVE NEGATIVE   RBC / HPF 0-5 0 - 5 RBC/hpf   WBC, UA 0-5 0 - 5 WBC/hpf   Bacteria, UA RARE (A) NONE SEEN    Comment: Performed at [facility] [address], [place], [place] [address]  CBC     Status: Abnormal   Collection Time: 02/01/22 11:42 PM  Result Value Ref Range   WBC 5.5 4.0 - 10.5 K/uL   RBC 3.44 (L) 3.87 - 5.11 MIL/uL   Hemoglobin 11.0 (L) 12.0 - 15.0 g/dL   HCT 32.8 (L) 36.0 - 46.0 %   MCV 95.3 80.0 - 100.0 fL   MCH 32.0 26.0 - 34.0 pg   MCHC 33.5 30.0 - 36.0 g/dL   RDW 13.2 11.5 - 15.5 %   Platelets 392 150 - 400 K/uL   nRBC 0.0 0.0 - 0.2 %    Comment: Performed at [facility], [place] [address], [place], [place] [address]  Comprehensive metabolic panel     Status: Abnormal   Collection Time: 02/02/22 12:34 AM  Result Value Ref Range   Sodium 137 135 - 145 mmol/L   Potassium 4.0 3.5 - 5.1 mmol/L   Chloride 107 98 - 111 mmol/L   CO2 20 (L) 22 - 32 mmol/L   Glucose, Bld 101 (H) 70 - 99 mg/dL    Comment: Glucose reference range applies only to samples taken after fasting for at least 8 hours.   BUN 20 6 - 20 mg/dL   Creatinine, Ser 0.92 0.44 - 1.00 mg/dL   Calcium 8.6 (L) 8.9 - 10.3 mg/dL   Total Protein 6.5 6.5 - 8.1 g/dL   Albumin 3.0 (L) 3.5 - 5.0 g/dL   AST 16 15 - 41 U/L   ALT 22 0 - 44 U/L   Alkaline Phosphatase 70 38 - 126 U/L   Total Bilirubin 0.5 0.3 - 1.2 mg/dL   GFR, Estimated  >60 >60 mL/min    Comment: (NOTE) Calculated using the CKD-EPI Creatinine Equation (2021)    Anion gap 10 5 - 15    Comment: Performed at [facility], [place] [address], [place], [place] [address]    MAU COURSE Labs checked and serial BP checked. No severe features noted.  ASSESSMENT 1. Postpartum hypertension     PLAN Counseled patient about her BPs while here in MAU. Recommended initiation of BP medications, Nifedipine XL 30 mg po qd recommended.   Also recommended continued home BP monitoring. Patient reported she will  review this recommendation with her OB and MFM specialist at [facility]. She was asked to follow up with her OB provider. Discharge home in stable condition   Allergies as of 02/02/2022   No Known Allergies      Medication List     STOP taking these medications    metoprolol tartrate 100 MG tablet Commonly known as: LOPRESSOR   valACYclovir 500 MG tablet Commonly known as: VALTREX       TAKE these medications    aspirin EC 81 MG tablet Take 162 mg by mouth 2 (two) times daily.   enoxaparin 40 MG/0.4ML injection Commonly known as: LOVENOX Inject 40 mg into the skin daily.   magnesium oxide 400 MG tablet Commonly known as: MAG-OX Take by mouth.   multivitamin tablet Take 1 tablet by mouth daily.   naproxen 375 MG tablet Commonly  known as: NAPROSYN Take 1 tablet (375 mg total) by mouth 2 (two) times daily.   NIFEdipine 30 MG 24 hr tablet Commonly known as: ADALAT CC Take 1 tablet (30 mg total) by mouth daily.   omeprazole 20 MG capsule Commonly known as: PRILOSEC Take 1 capsule (20 mg total) by mouth daily for 14 days.   Pegasys 180 MCG/ML injection Generic drug: peginterferon alfa-2a Inject 180 mcg into the skin every 7 (seven) days. Per patient taking 45 mg every 14 days   Prenatal+DHA 28-0.975 & 200 MG Misc Take 1 tablet by mouth daily.   traZODone 25 mg Tabs tablet Commonly known as: DESYREL        Evaluation does  not show pathology that would require ongoing emergent intervention or inpatient treatment. Patient is hemodynamically stable and mentating appropriately. Discussed findings and plan with patient, who agrees with care plan. All questions answered. Return precautions discussed and outpatient follow up recommendations given.  Osborne Oman, MD 02/02/2022 2:33 AM

## 2022-02-23 ENCOUNTER — Other Ambulatory Visit: Payer: Self-pay | Admitting: Obstetrics & Gynecology

## 2022-03-13 NOTE — Progress Notes (Unsigned)
03/13/2022 Shannon Mcdonald [id number] 08-27-1980 CHIEF COMPLAINT: Discuss scheduling a colonoscopy, rectal bleeding   HISTORY OF PRESENT ILLNESS: Shannon Mcdonald is a 42 year old female with a past medical history of polycythemia vera 2015, thrombocytosis, post partum hypertension, questionable TIA and GERD. S/P C section 09/2018 and 01/2022. She presents to our office today as referred by Dr. Azucena Kuba with [facility] for further evaluation regarding rectal hemorrhoids x 5 to 10 years.  She describes having intermittent bright red blood which she sees on the toilet tissue and in the toilet water for the past 5 to 10 years she attributes to having hemorrhoids which tend to flare more during pregnancy.  On New Year's Eve 12/31 or  New Years Day 03/2022, she passed a few loose stools and 1 episode occurred with passing a moderate amount of dark red blood with clots.  No associated abdominal or anorectal pain.  No further rectal bleeding or loose stools since then.  She has occasional constipation for which she takes a stool softener daily.  She stopped taking a stool softener after she had the 1 episode of bloody stool then restarted it a few days ago.  Mother with history of colon polyps.  No known family history of colorectal cancer.  She is s/p C section 01/2022, breast feeding her infant daughter.   She has a history of polycythemia vera and thrombocytosis followed by hematology at [facility] treated with Pegasys/Interferon injections, ASA 81 mg p.o. twice daily and intermittent phlebotomy. No other NSAID use.  She has a history of a mildly elevated ALT level and a small gallbladder polyp per sono which she reported were followed by her hematologist and liver specialist at [facility].  Labs 05/31/2020: IgG 1236.  SMA negative.  AMA 2.4.  ANA positive.  Hep C antibody nonreactive.  Hep B surface antigen nonreactive.  Hepatitis B surface antibody nonreactive.  Hepatitis B core total antibody  nonreactive.  Hepatitis A IgG reactive.  Labs 03/09/2022: WBC 3.6.  Hemoglobin 13.3.  Hematocrit 38.9.  Platelets 268.  Glucose 99.  BUN 14.  Creatinine 0.91.  Sodium 140.  Potassium 4.6.  Albumin 4.5.  Total bili 0.3.  Alk phos 83.  AST 36.  ALT 79.  Abdominal ultrasound 05/20/2020: Normal liver and gallbladder.  Abdominal sonogram 02/06/2021: Normal liver and a potential small 4 mm gallbladder polyp.   ECHO 02/21/2021: IMPRESSIONS Left ventricular ejection fraction, by estimation, is 60 to 65%. The left ventricle has normal function. The left ventricle has no regional wall motion abnormalities. Left ventricular diastolic parameters were normal. The average left ventricular global longitudinal strain is -22.2 %. The global longitudinal strain is normal. 1. 2. Right ventricular systolic function is normal. The right ventricular size is normal. The mitral valve is normal in structure. Trivial mitral valve regurgitation. No evidence of mitral stenosis. 3. The aortic valve is normal in structure. Aortic valve regurgitation is not visualized. No aortic stenosis is present. 4. The inferior vena cava is normal in size with greater than 50% respiratory variability, suggesting right atrial pressure of 3 mmHg.   Past Medical History:  Diagnosis Date   Allergy    Altitude sickness    GERD (gastroesophageal reflux disease)    Polycythemia vera (HCC)    Thrombocytosis 09/29/2013   TIA (transient ischemic attack) 09/29/2013   Past Surgical History:  Procedure Laterality Date   CESAREAN SECTION     NASAL SINUS SURGERY     Social History:  She is married.  She has 1 son and 1 infant daughter.  Non-smoker.  No alcohol use.  No drug use.  Family History: Mother with history of hypertension and colon polyps. Father with history of prostate cancer.  No known family history of esophageal, gastric or colon cancer.  No Known Allergies   Outpatient Encounter Medications as of 03/14/2022   Medication Sig   aspirin EC 81 MG tablet Take 162 mg by mouth 2 (two) times daily.  (Patient not taking: Reported on 02/01/2022)   enoxaparin (LOVENOX) 40 MG/0.4ML injection Inject 40 mg into the skin daily.   magnesium oxide (MAG-OX) 400 MG tablet Take by mouth. (Patient not taking: Reported on 02/01/2022)   Multiple Vitamin (MULTIVITAMIN) tablet Take 1 tablet by mouth daily.   naproxen (NAPROSYN) 375 MG tablet Take 1 tablet (375 mg total) by mouth 2 (two) times daily. (Patient not taking: Reported on 02/03/2021)   NIFEdipine (ADALAT CC) 30 MG 24 hr tablet Take 1 tablet (30 mg total) by mouth daily.   omeprazole (PRILOSEC) 20 MG capsule Take 1 capsule (20 mg total) by mouth daily for 14 days. (Patient not taking: Reported on 02/03/2021)   peginterferon alfa-2a (PEGASYS) 180 MCG/ML injection Inject 180 mcg into the skin every 7 (seven) days. Per patient taking 45 mg every 14 days   Prenatal MV-Min-Fe Fum-FA-DHA (PRENATAL+DHA) 28-0.975 & 200 MG MISC Take 1 tablet by mouth daily.   traZODone (DESYREL) 25 mg TABS tablet  (Patient not taking: Reported on 02/01/2022)   No facility-administered encounter medications on file as of 03/14/2022.    REVIEW OF SYSTEMS:  Gen: + Fatigue. + Night sweats post partum. Denies fever or chills. No weight loss.  CV: Denies chest pain, palpitations or edema. Resp: Denies cough, shortness of breath of hemoptysis.  GI: See HPI. GU: + Urine leakage during pregnancy. MS: Denies joint pain, muscles aches or weakness. Derm: Denies rash, itchiness, skin lesions or unhealing ulcers. Psych: Denies depression, anxiety, memory loss or confusion. Heme: Denies bruising, easy bleeding. Neuro:  Denies headaches, dizziness or paresthesias. Endo:  Denies any problems with DM, thyroid or adrenal function.  PHYSICAL EXAM: BP (!) 120/90   Pulse (!) 59   Ht '5\' 5"'$  (1.651 m)   Wt 171 lb (77.6 kg)   Breastfeeding Yes   BMI 28.46 kg/m  No menstrual cycle while breast feeding,  not utilizing birth control at this time.   General: 42 year old female in no acute distress. Head: Normocephalic and atraumatic. Eyes:  Sclerae non-icteric, conjunctive pink. Ears: Normal auditory acuity. Mouth: Dentition intact. No ulcers or lesions.  Neck: Supple, no lymphadenopathy or thyromegaly.  Lungs: Clear bilaterally to auscultation without wheezes, crackles or rhonchi. Heart: Regular rate and rhythm. No murmur, rub or gallop appreciated.  Abdomen: Soft, nontender, nondistended. No masses. No hepatosplenomegaly. Normoactive bowel sounds x 4 quadrants.  Rectal: Small anterior external hemorrhoid without inflammation or active bleeding.  Very small external hemorrhoid versus anal tag to the left posterior anal area.  No fissures appreciated.  Internal hemorrhoids without prolapse.  No blood in the rectal vault. DD CMA present during exam.  Musculoskeletal: Symmetrical with no gross deformities. Skin: Warm and dry. No rash or lesions on visible extremities. Extremities: No edema. Neurological: Alert oriented x 4, no focal deficits.  Psychological:  Alert and cooperative. Normal mood and affect.  ASSESSMENT AND PLAN:  43) 42 year old female with internal and external hemorrhoids with  intermittent rectal bleeding x 5 to 10 years with  one episode of dark red blood with loose stool x 1 03/04/2022 -03/06/2022 without recurrence. Mother with history of colon polyps. -Colonoscopy benefits and risks discussed including risk with sedation, risk of bleeding, perforation and infection.  Patient will call our office when she is ready to schedule a colonoscopy. -Check Beta HCG 3 to 4 days prior to procedure date to rule out any chance of pregnancy prior to procedure as patient is breast-feeding and not utilizing birth control at this time -Patient will contact her pediatrician to verify breast-feeding instructions post colonoscopy -Apply a small amount of Desitin inside the anal opening and to the  external anal area three times daily as needed for anal or hemorrhoidal irritation/bleeding.  -Patient to contact our office if rectal bleeding worsens prior to colonoscopy date  2) Constipation -Benefiber 1 tablespoon daily as tolerated -Drink 64 ounces of water daily  3) History of polycythemia vera and thrombocytosis followed by hematology at [facility] ASA 81 mg twice daily and Pegasys/interferon injections  4) History of an elevated ALT and a small gallbladder polyp followed by her hematologist and liver specialist at [facility]  ANA positive with normal IgG and negative SMA levels.  Active hepatitis B and C serologies. -Instructed to follow-up with her [facility] liver specialist Dr. Marene Lenz -Recommend ceruloplasmin level and alpha-1 antitrypsin level with next lab draw -Recommend hepatitis B vaccination if not already done          CC:  Chesley Noon, MD

## 2022-03-14 ENCOUNTER — Encounter: Payer: Self-pay | Admitting: Nurse Practitioner

## 2022-03-14 ENCOUNTER — Ambulatory Visit (INDEPENDENT_AMBULATORY_CARE_PROVIDER_SITE_OTHER): Payer: BC Managed Care – PPO | Admitting: Nurse Practitioner

## 2022-03-14 VITALS — BP 120/90 | HR 59 | Ht 65.0 in | Wt 171.0 lb

## 2022-03-14 DIAGNOSIS — K649 Unspecified hemorrhoids: Secondary | ICD-10-CM

## 2022-03-14 DIAGNOSIS — Z83719 Family history of colon polyps, unspecified: Secondary | ICD-10-CM

## 2022-03-14 DIAGNOSIS — K625 Hemorrhage of anus and rectum: Secondary | ICD-10-CM

## 2022-03-14 NOTE — Patient Instructions (Addendum)
Apply a small amount of Desitin inside the anal opening and to the external anal area three times daily as needed for anal or hemorrhoidal irritation/bleeding.   Benefiber one tablespoon once daily as tolerated   Stool softener 1 capsule daily as tolerated   Drink 8 glasses of water daily (64 ounces daily)  Call our office when you are ready to schedule a colonoscopy   Contact your pediatrician regarding breast feeding instructions following colonoscopy with Propofol utilized for sedation  Please have labs (beta HCG level/pregnancy test) done in our lab in the basement of our office building 3 to 4 days before your colonoscopy date.  Contact our office if your rectal bleeding recurs or worsens   It has been recommended to you by your physician that you have a(n) Colonoscopy completed. Per your request, we did not schedule the procedure(s) today. Please contact our office at [phone] should you decide to have the procedure completed. You will be scheduled for a pre-visit and procedure at that time.  Thank you for trusting me with your gastrointestinal care!   Carl Best, CRNP

## 2022-03-15 NOTE — Progress Notes (Signed)
Reviewed and agree with management plans. ? ?Guida Asman L. Maylyn Narvaiz, MD, MPH  ?

## 2022-05-02 ENCOUNTER — Encounter: Payer: Self-pay | Admitting: Gastroenterology

## 2022-06-05 ENCOUNTER — Encounter: Payer: Self-pay | Admitting: Gastroenterology

## 2022-06-05 ENCOUNTER — Ambulatory Visit (AMBULATORY_SURGERY_CENTER): Payer: BC Managed Care – PPO | Admitting: *Deleted

## 2022-06-05 VITALS — Ht 65.0 in | Wt 165.0 lb

## 2022-06-05 DIAGNOSIS — Z1211 Encounter for screening for malignant neoplasm of colon: Secondary | ICD-10-CM

## 2022-06-05 MED ORDER — NA SULFATE-K SULFATE-MG SULF 17.5-3.13-1.6 GM/177ML PO SOLN: 1.0000 | Freq: Once | ORAL | 0 refills | Status: AC

## 2022-06-05 NOTE — Progress Notes (Signed)
Pre visit completed over telephone.  Instructions forwarded through MyChart.    No egg or soy allergy known to patient  No issues known to pt with past sedation with any surgeries or procedures Patient denies ever being told they had issues or difficulty with intubation  No FH of Malignant Hyperthermia Pt is not on diet pills Pt is not on  home 02  Pt is not on blood thinners  Pt denies issues with constipation  No A fib or A flutter Have any cardiac testing pending-NO Pt instructed to use Singlecare.com or GoodRx for a price reduction on prep   

## 2022-06-11 ENCOUNTER — Telehealth: Payer: Self-pay | Admitting: Gastroenterology

## 2022-06-11 DIAGNOSIS — K625 Hemorrhage of anus and rectum: Secondary | ICD-10-CM

## 2022-06-11 NOTE — Telephone Encounter (Signed)
Patient is scheduled for a colonoscopy on 06/20/2022. States her paperwork is listing the colonoscopy as preventive but states that it should be diagnostic. Patient wants to make sure that is correct for her insurance. Please advise.

## 2022-06-11 NOTE — Telephone Encounter (Signed)
Resubmitted ambulatory referral for rectal bleeding,advised pt. To give couple of days call insurance and touch base with our insurance people to make sure procedure is covered.verbalized understanding,all questions answered.

## 2022-06-20 ENCOUNTER — Ambulatory Visit (AMBULATORY_SURGERY_CENTER): Payer: BC Managed Care – PPO | Admitting: Gastroenterology

## 2022-06-20 ENCOUNTER — Encounter: Payer: Self-pay | Admitting: Gastroenterology

## 2022-06-20 VITALS — BP 108/70 | HR 46 | Temp 98.4°F | Resp 17 | Ht 65.0 in | Wt 165.0 lb

## 2022-06-20 DIAGNOSIS — K921 Melena: Secondary | ICD-10-CM

## 2022-06-20 DIAGNOSIS — Z83719 Family history of colon polyps, unspecified: Secondary | ICD-10-CM | POA: Diagnosis not present

## 2022-06-20 MED ORDER — SODIUM CHLORIDE 0.9 % IV SOLN: 500.0000 mL | Freq: Once | INTRAVENOUS | Status: DC

## 2022-06-20 NOTE — Progress Notes (Signed)
Cell phone off per pt   Pt pumped breast milk while in admitting  Pt's states no medical or surgical changes since previsit or office visit.

## 2022-06-20 NOTE — Progress Notes (Signed)
History & Physical  Primary Care Physician:  Eartha Inch, MD Primary Gastroenterologist: Claudette Head, MD  Impression / Plan:  Hematochezia and family history of colon polyps for colonoscopy.   CHIEF COMPLAINT:  Hematochezia, Family history of colon polyps   HPI: Shannon Mcdonald is a 42 y.o. female with hematochezia and family history of colon polyps for colonoscopy.    Past Medical History:  Diagnosis Date   Allergy    Altitude sickness    GERD (gastroesophageal reflux disease)    Polycythemia vera    Thrombocytosis 09/29/2013   TIA (transient ischemic attack) 09/29/2013    Past Surgical History:  Procedure Laterality Date   CESAREAN SECTION     NASAL SINUS SURGERY      Prior to Admission medications   Medication Sig Start Date End Date Taking? Authorizing Provider  aspirin EC 81 MG tablet Take 162 mg by mouth 2 (two) times daily.   Yes [provider]  diltiazem (CARDIZEM SR) 60 MG 12 hr capsule Take by mouth. 05/09/22  Yes [provider]  Docosahexaenoic Acid (DHA PO) Take by mouth. 830 mg daily   Yes [provider]  magnesium oxide (MAG-OX) 400 MG tablet Take by mouth.   Yes [provider]  Prenatal MV-Min-Fe Fum-FA-DHA (PRENATAL+DHA) 28-0.975 & 200 MG MISC Take 1 tablet by mouth daily.   Yes [provider]  peginterferon alfa-2a (PEGASYS) 180 MCG/ML injection Inject 180 mcg into the skin every 7 (seven) days. Per patient taking 45 mg every 14 days    [provider]    Current Outpatient Medications  Medication Sig Dispense Refill   aspirin EC 81 MG tablet Take 162 mg by mouth 2 (two) times daily.     diltiazem (CARDIZEM SR) 60 MG 12 hr capsule Take by mouth.     Docosahexaenoic Acid (DHA PO) Take by mouth. 830 mg daily     magnesium oxide (MAG-OX) 400 MG tablet Take by mouth.     Prenatal MV-Min-Fe Fum-FA-DHA (PRENATAL+DHA) 28-0.975 & 200 MG MISC Take 1 tablet by mouth daily.     peginterferon  alfa-2a (PEGASYS) 180 MCG/ML injection Inject 180 mcg into the skin every 7 (seven) days. Per patient taking 45 mg every 14 days     Current Facility-Administered Medications  Medication Dose Route Frequency Provider Last Rate Last Admin   0.9 %  sodium chloride infusion  500 mL Intravenous Once Meryl Dare, MD        Allergies as of 06/20/2022   (No Known Allergies)    Family History  Problem Relation Age of Onset   Hypertension Mother    Colonic polyp Mother    Colon polyps Father    Colon cancer Neg Hx    Esophageal cancer Neg Hx    Stomach cancer Neg Hx    Rectal cancer Neg Hx     Social History   Socioeconomic History   Marital status: Married    Spouse name: Not on file   Number of children: Not on file   Years of education: Not on file   Highest education level: Not on file  Occupational History   Not on file  Tobacco Use   Smoking status: Never   Smokeless tobacco: Never  Vaping Use   Vaping Use: Never used  Substance and Sexual Activity   Alcohol use: Not Currently   Drug use: No   Sexual activity: Not Currently    Birth control/protection: Abstinence  Other  Topics Concern   Not on file  Social History Narrative   Not on file   Social Determinants of Health   Financial Resource Strain: Not on file  Food Insecurity: Not on file  Transportation Needs: Not on file  Physical Activity: Not on file  Stress: Not on file  Social Connections: Not on file  Intimate Partner Violence: Not on file    Review of Systems:  All systems reviewed were negative except where noted in HPI.   Physical Exam: General:  Alert, well-developed, in NAD Head:  Normocephalic and atraumatic. Eyes:  Sclera clear, no icterus.   Conjunctiva pink. Ears:  Normal auditory acuity. Mouth:  No deformity or lesions.  Neck:  Supple; no masses. Lungs:  Clear throughout to auscultation.   No wheezes, crackles, or rhonchi.  Heart:  Regular rate and rhythm; no murmurs. Abdomen:   Soft, nondistended, nontender. No masses, hepatomegaly. No palpable masses.  Normal bowel sounds.    Rectal:  Deferred   Msk:  Symmetrical without gross deformities. Extremities:  Without edema. Neurologic:  Alert and  oriented x 4; grossly normal neurologically. Skin:  Intact without significant lesions or rashes. Psych:  Alert and cooperative. Normal mood and affect.   Venita Lick. Russella Dar  06/20/2022, 10:00 AM See Loretha Stapler, [facility], to contact our on call provider

## 2022-06-20 NOTE — Progress Notes (Signed)
Vss nad grans to pacu

## 2022-06-20 NOTE — Patient Instructions (Signed)
Thank you for coming in to see [place] today! Resume your diet and regular medications/supplements today. Return to regular daily activities tomorrow. Recommend next screening colonoscopy in 5 years.    YOU HAD AN ENDOSCOPIC PROCEDURE TODAY AT THE [facility]:   Refer to the procedure report that was given to you for any specific questions about what was found during the examination.  If the procedure report does not answer your questions, please call your gastroenterologist to clarify.  If you requested that your care partner not be given the details of your procedure findings, then the procedure report has been included in a sealed envelope for you to review at your convenience later.  YOU SHOULD EXPECT: Some feelings of bloating in the abdomen. Passage of more gas than usual.  Walking can help get rid of the air that was put into your GI tract during the procedure and reduce the bloating. If you had a lower endoscopy (such as a colonoscopy or flexible sigmoidoscopy) you may notice spotting of blood in your stool or on the toilet paper. If you underwent a bowel prep for your procedure, you may not have a normal bowel movement for a few days.  Please Note:  You might notice some irritation and congestion in your nose or some drainage.  This is from the oxygen used during your procedure.  There is no need for concern and it should clear up in a day or so.  SYMPTOMS TO REPORT IMMEDIATELY:  Following lower endoscopy (colonoscopy or flexible sigmoidoscopy):  Excessive amounts of blood in the stool  Significant tenderness or worsening of abdominal pains  Swelling of the abdomen that is new, acute  Fever of 100F or higher     For urgent or emergent issues, a gastroenterologist can be reached at any hour by calling [phone]. Do not use MyChart messaging for urgent concerns.    DIET:  We do recommend a small meal at first, but then you may proceed to your regular diet.  Drink  plenty of fluids but you should avoid alcoholic beverages for 24 hours.  ACTIVITY:  You should plan to take it easy for the rest of today and you should NOT DRIVE or use heavy machinery until tomorrow (because of the sedation medicines used during the test).    FOLLOW UP: Our staff will call the number listed on your records the next business day following your procedure.  We will call around 7:15- 8:00 am to check on you and address any questions or concerns that you may have regarding the information given to you following your procedure. If we do not reach you, we will leave a message.     If any biopsies were taken you will be contacted by phone or by letter within the next 1-3 weeks.  Please call us at [phone] if you have not heard about the biopsies in 3 weeks.    SIGNATURES/CONFIDENTIALITY: You and/or your care partner have signed paperwork which will be entered into your electronic medical record.  These signatures attest to the fact that that the information above on your After Visit Summary has been reviewed and is understood.  Full responsibility of the confidentiality of this discharge information lies with you and/or your care-partner.

## 2022-06-20 NOTE — Op Note (Signed)
[facility] Patient Name: Shannon Mcdonald Procedure Date: 06/20/2022 10:01 AM MRN: [id number] Endoscopist: Meryl Dare , MD, [phone] Age: 42 Referring MD:  Date of Birth: 04-Sep-1980 Gender: Female Account #: [id number] Procedure:                Colonoscopy Indications:              Hematochezia, Family history of colon polyps Medicines:                Monitored Anesthesia Care Procedure:                Pre-Anesthesia Assessment:                           - Prior to the procedure, a History and Physical                            was performed, and patient medications and                            allergies were reviewed. The patient's tolerance of                            previous anesthesia was also reviewed. The risks                            and benefits of the procedure and the sedation                            options and risks were discussed with the patient.                            All questions were answered, and informed consent                            was obtained. Prior Anticoagulants: The patient has                            taken no anticoagulant or antiplatelet agents. ASA                            Grade Assessment: II - A patient with mild systemic                            disease. After reviewing the risks and benefits,                            the patient was deemed in satisfactory condition to                            undergo the procedure.                           After obtaining informed consent, the colonoscope  was passed under direct vision. Throughout the                            procedure, the patient's blood pressure, pulse, and                            oxygen saturations were monitored continuously. The                            Olympus PCF-H190DL ([id number]) Colonoscope was                            introduced through the anus and advanced to the the                            cecum,  identified by appendiceal orifice and                            ileocecal valve. The ileocecal valve, appendiceal                            orifice, and rectum were photographed. The quality                            of the bowel preparation was good. The colonoscopy                            was performed without difficulty. The patient                            tolerated the procedure well. Scope In: 10:12:30 AM Scope Out: 10:28:25 AM Scope Withdrawal Time: 0 hours 10 minutes 48 seconds  Total Procedure Duration: 0 hours 15 minutes 55 seconds  Findings:                 The perianal and digital rectal examinations were                            normal.                           External and internal hemorrhoids were found during                            retroflexion. The hemorrhoids were small and Grade                            I (internal hemorrhoids that do not prolapse).                           The exam was otherwise without abnormality on                            direct and retroflexion views. Complications:            No  immediate complications. Estimated blood loss:                            None. Estimated Blood Loss:     Estimated blood loss: none. Impression:               - External and internal hemorrhoids.                           - The examination was otherwise normal on direct                            and retroflexion views.                           - No specimens collected. Recommendation:           - Repeat colonoscopy in 5 years for screening                            purposes.                           - Patient has a contact number available for                            emergencies. The signs and symptoms of potential                            delayed complications were discussed with the                            patient. Return to normal activities tomorrow.                            Written discharge instructions were provided to the                             patient.                           - Resume previous diet.                           - Continue present medications. Meryl Dare, MD 06/20/2022 10:31:32 AM This report has been signed electronically.

## 2022-06-21 ENCOUNTER — Telehealth: Payer: Self-pay | Admitting: *Deleted

## 2022-06-21 NOTE — Telephone Encounter (Signed)
Attempted to call patient for their post-procedure follow-up call. No answer. Left voicemail.   

## 2022-07-05 ENCOUNTER — Encounter: Payer: Self-pay | Admitting: Gastroenterology

## 2022-07-06 NOTE — Telephone Encounter (Signed)
Dr Russella Dar see the message from pt. She is aware you are off until 5/5.

## 2022-11-14 ENCOUNTER — Emergency Department (HOSPITAL_COMMUNITY): Payer: BC Managed Care – PPO

## 2022-11-14 ENCOUNTER — Emergency Department (HOSPITAL_COMMUNITY)
Admission: EM | Admit: 2022-11-14 | Discharge: 2022-11-14 | Disposition: A | Discharge status: Home / Self Care | Payer: BC Managed Care – PPO

## 2022-11-14 ENCOUNTER — Other Ambulatory Visit: Payer: Self-pay

## 2022-11-14 ENCOUNTER — Encounter (HOSPITAL_COMMUNITY): Payer: Self-pay

## 2022-11-14 DIAGNOSIS — R0789 Other chest pain: Secondary | ICD-10-CM | POA: Diagnosis present

## 2022-11-14 DIAGNOSIS — R079 Chest pain, unspecified: Secondary | ICD-10-CM

## 2022-11-14 DIAGNOSIS — Z7982 Long term (current) use of aspirin: Secondary | ICD-10-CM | POA: Insufficient documentation

## 2022-11-14 DIAGNOSIS — R42 Dizziness and giddiness: Secondary | ICD-10-CM | POA: Diagnosis not present

## 2022-11-14 LAB — BASIC METABOLIC PANEL
Anion gap: 10 (ref 5–15)
BUN: 17 mg/dL (ref 6–20)
CO2: 26 mmol/L (ref 22–32)
Calcium: 9.5 mg/dL (ref 8.9–10.3)
Chloride: 105 mmol/L (ref 98–111)
Creatinine, Ser: 0.92 mg/dL (ref 0.44–1.00)
GFR, Estimated: >60 mL/min (ref >60)
Glucose, Bld: 91 mg/dL (ref 70–99)
Potassium: 4.1 mmol/L (ref 3.5–5.1)
Sodium: 141 mmol/L (ref 135–145)

## 2022-11-14 LAB — CBC
HCT: 40.2 % (ref 36.0–46.0)
Hemoglobin: 13.5 g/dL (ref 12.0–15.0)
MCH: 30.3 pg (ref 26.0–34.0)
MCHC: 33.6 g/dL (ref 30.0–36.0)
MCV: 90.1 fL (ref 80.0–100.0)
Platelets: 200 10*3/uL (ref 150–400)
RBC: 4.46 MIL/uL (ref 3.87–5.11)
RDW: 12.5 % (ref 11.5–15.5)
WBC: 3.7 10*3/uL — ABNORMAL LOW (ref 4.0–10.5)
nRBC: 0 % (ref 0.0–0.2)

## 2022-11-14 LAB — TROPONIN I (HIGH SENSITIVITY)
Troponin I (High Sensitivity): 6 ng/L (ref <18)
Troponin I (High Sensitivity): 5 ng/L (ref <18)

## 2022-11-14 LAB — HCG, SERUM, QUALITATIVE: Preg, Serum: NEGATIVE

## 2022-11-14 NOTE — ED Triage Notes (Addendum)
Pt arrives via POV. Pt reports left sided chest pain since yesterday. Pt reports associated dizziness. Pt is AxOx4. Hx of polycythemia vera.

## 2022-11-14 NOTE — ED Notes (Signed)
Patient verbalizes understanding of discharge instructions. Opportunity for questioning and answers were provided. Armband removed by staff, pt discharged from ED. Pt ambulatory to ED waiting room with steady gait.  

## 2022-11-14 NOTE — ED Provider Notes (Signed)
[place] EMERGENCY DEPARTMENT AT [facility] Provider Note   CSN: [id number] Arrival date & time: 11/14/22  1605     History  Chief Complaint  Patient presents with   Chest Pain    Shannon Mcdonald is a 42 y.o. female.  43 year old female to the emergency department chief complaint chest pain.  Onset last night at dinnertime.  Has been left-sided chest, has moved to several different locations above the breast, below the breast, mid axillary line.  Described as a tightness.  Some mild lightheadedness at times, none currently.  Reports having negative cardiac workup 2 years ago.  History of P vera.  Low risk for PE based on most criteria. Patient is breast-feeding, but reports no erythema, discharge to her breast.    Chest Pain      Home Medications Prior to Admission medications   Medication Sig Start Date End Date Taking? Authorizing Provider  aspirin EC 81 MG tablet Take 162 mg by mouth 2 (two) times daily.    [provider]  diltiazem (CARDIZEM SR) 60 MG 12 hr capsule Take by mouth. 05/09/22   [provider]  Docosahexaenoic Acid (DHA PO) Take by mouth. 830 mg daily    [provider]  magnesium oxide (MAG-OX) 400 MG tablet Take by mouth.    [provider]  peginterferon alfa-2a (PEGASYS) 180 MCG/ML injection Inject 180 mcg into the skin every 7 (seven) days. Per patient taking 45 mg every 14 days    [provider]  Prenatal MV-Min-Fe Fum-FA-DHA (PRENATAL+DHA) 28-0.975 & 200 MG MISC Take 1 tablet by mouth daily.    [provider]      Allergies    Patient has no known allergies.    Review of Systems   Review of Systems  Cardiovascular:  Positive for chest pain.    Physical Exam Updated Vital Signs BP 129/85   Pulse (!) 53   Temp 97.9 F (36.6 C) (Oral)   Resp 12   Ht 5\' 5"  (1.651 m)   Wt 70.3 kg   SpO2 100%   BMI 25.79 kg/m  Physical Exam Vitals and nursing note reviewed.   Constitutional:      General: She is not in acute distress.    Appearance: She is not toxic-appearing.  HENT:     Head: Normocephalic.  Cardiovascular:     Rate and Rhythm: Normal rate and regular rhythm.     Heart sounds: Normal heart sounds.  Pulmonary:     Effort: Pulmonary effort is normal.     Breath sounds: Normal breath sounds.  Chest:     Chest wall: No tenderness.     Comments: Deferred skin/breast exam due to patient comfort.  Denies any erythema or breast tenderness. Musculoskeletal:        General: Normal range of motion.     Right lower leg: No tenderness. No edema.     Left lower leg: No tenderness. No edema.  Skin:    General: Skin is warm.  Neurological:     General: No focal deficit present.     Mental Status: She is alert.  Psychiatric:        Mood and Affect: Mood normal.        Behavior: Behavior normal.     ED Results / Procedures / Treatments   Labs (all labs ordered are listed, but only abnormal results are displayed) Labs Reviewed  CBC - Abnormal; Notable for the following components:  Result Value   WBC 3.7 (*)    All other components within normal limits  BASIC METABOLIC PANEL  HCG, SERUM, QUALITATIVE  TROPONIN I (HIGH SENSITIVITY)  TROPONIN I (HIGH SENSITIVITY)    EKG EKG Interpretation Date/Time:  Wednesday November 14 2022 16:51:15 EDT Ventricular Rate:  61 PR Interval:  154 QRS Duration:  92 QT Interval:  394 QTC Calculation: 396 R Axis:   22  Text Interpretation: Normal sinus rhythm Normal ECG When compared with ECG of 25-Jan-2021 14:53, PREVIOUS ECG IS PRESENT Normal tracing Confirmed by Anders Simmonds [id number]) on 11/14/2022 4:55:39 PM  Radiology DG Chest 2 View  Result Date: 11/14/2022 CLINICAL DATA:  Chest pain. EXAM: CHEST - 2 VIEW COMPARISON:  January 25, 2021. FINDINGS: The heart size and mediastinal contours are within normal limits. Both lungs are clear. The visualized skeletal structures are unremarkable.  IMPRESSION: No active cardiopulmonary disease. Electronically Signed   By: Lupita Raider M.D.   On: 11/14/2022 19:06    Procedures Procedures    Medications Ordered in ED Medications - No data to display  ED Course/ Medical Decision Making/ A&P Clinical Course as of 11/14/22 2114  Wed Nov 14, 2022  2114 Last cardiac workup "MPRESSION: 1. Coronary calcium score of 0. This was 0 percentile for age and sex matched control.   2. Normal coronary origin with right dominance.   3. No evidence of CAD. " [TY]    Clinical Course User Index [TY] Coral Spikes, DO                                 Medical Decision Making Is well-appearing 42 year old female presenting emergency department for chest pain.  She is afebrile, hemodynamically stable.  She is bradycardic.  Reports that her heart rate is low as she is taking diltiazem to help with her blood pressure.  Per chart review does appear that your heart rate does run in the upper 50s.  Low risk chest pain.  Troponin negative.  EKG without ST segment changes to get ischemia.  ACS unlikely.  Low risk for PE based on Wells criteria.  PERC negative.  She has no leukocytosis to suggest systemic infection.  Hemoglobin not consistent with polycythemia.  Chest x-ray without pneumothorax or widened mediastinum.  No overt pneumonia.  Discussed supportive medications such as Tylenol for possible MSK etiology.  She voiced understanding.  She will follow-up with her primary doctor.  Return precautions given.  Amount and/or Complexity of Data Reviewed External Data Reviewed: notes.    Details: See ED course Labs: ordered. Radiology: ordered.   Document critical care time when appropriate:1}      Final Clinical Impression(s) / ED Diagnoses Final diagnoses:  None    Rx / DC Orders ED Discharge Orders     None         Coral Spikes, DO 11/14/22 2114

## 2023-05-16 NOTE — Telephone Encounter (Signed)
 Received message from patient that TP orders at [facility] had expired.  New orders faxed.

## 2023-09-19 NOTE — Progress Notes (Signed)
 Myeloproliferative Neoplasms & Bone Marrow Failure Clinic Established Visit  Patient: Shannon Mcdonald MRN: [id number] DOB: May 17, 1980 Date of Visit: 09/20/2023  Referred By: Fairy Ada, MD - [facility] (retired) External Hematologist: Viktoria JULIANNA Curls, MD - [facility].   Harlene Haver, [place] - [phone] (fax)  Reason for Visit  Shannon Mcdonald is a 43 y.o. with hemi-migraines/trigeminal neuralgia, +ANA, ophthalmic zoster, and polycythemia vera diagnosed in 2015 and with history of TIA here in routine follow-up.    Interval History  Since last being seen by me on 03/11/2023, Shannon Mcdonald continues on Pegasys 45mcg but changed the frequency from every 14 days to every 10 days due to inadequately controlled hematocrit.  Most recent TP 07/17/23.  Recall that goal hematocrit is <40% due to symptoms above that.  Since increasing Pegasys to q10 days (interval is between 10-14 days), hematocrit is 36%. WBC normal.   Continues to take twice daily aspirin 81mg .    She offers no specific complaints today.  On questioning:   Review of Systems  She experiences occasional headaches, which have improved, attributing a recent headache to sleep deprivation while solo parenting.  No chest pain, swelling, numbness, or tingling in her hands and feet.  She experiences mild itching, particularly at night during baths or showers.  No feeling of fullness after a few bites of food or abdominal pain.  No injection reactions with Pegasys beyond a small red mark at the injection site.  She mentions a recent episode involving her eye, which became red and blurry while traveling for work. She has a history of ocular herpes and was prescribed Valtrex and an ointment after contacting her eye doctor. An eye doctor in [place] suggested it might be due to dry eyes, but she finds this diagnosis inconsistent with her symptoms, as the issue is unilateral and occurs once or twice a year, often related to stress.  This has  occurred on both the right and left eye but not at the same time.   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 She reports occasional restless legs at night, which she manages with magnesium  glycinate, and denies any diarrhea from this supplement.  She mentions that [facility] now charges $100 for therapeutic phlebotomies, which she finds inconvenient. She has not checked her ferritin levels recently.  Physical Examination   Vitals:   09/20/23 0938  BP: 122/84  Pulse: 64  Resp: 17  Temp: 36.4 C (97.6 F)  TempSrc: Temporal  SpO2: 98%  Weight: 72.5 kg (159 lb 13.3 oz)    GENERAL:  Well-appearing and in no apparent distress.  No apparent dysmorphia. Alone for her visit.  HEENT:  Anicteric sclera.  No oropharyngeal ulceration nor erythema. CV:  RRR.  No m/r/g RESP:  CTA bilaterally.  Speaking comfortably in full sentences.  ABD:  no hepatosplenomegaly.  NEURO:  AO X 4. CN II-XII grossly intact and symmetric.   Gait is examined and normal. SKIN: No petechiae nor purpura.  No rashes.  PSYCH:  Mood is well, affect is full and congruent.  No psychomotor agitation nor retardation.  Thought process is goal-directed and linear.    Laboratory Testing and Imaging   02/25/2023: WBC 3.5, hemoglobin 13, hematocrit 39, platelets 188, serum creatinine 0.88, normal LFTs. 03/07/2023: WBC 3.4, hemoglobin 12.6, hematocrit 38.7, platelets 188.  Serum creatinine 0.80, normal LFTs.  Ferritin 44, TSH 1.35.  LDH 152  07/15/23:  WBC 3.1,  ANC 1.9, Hgb 12.8, Hmct 40.7, Plts 185.    07/17/23:  Therapeutic Phlebotomy at [facility].  --> Changed Pegasys to 45mcg subcutaneous 10 days from 14 days.    08/12/23: WBC 5.1, Hgb 11.5, Hmct 36.5, Plts 245   09/16/23: WBC 3.5, Hgb 11.6, Hmct 36.3, Plts 199.  Normal sCR, normal LFTs.  *Note: last molecular testing was 05/2019 -- should update.   Assessment  #1 Polycythemia vera #2 (+) ANA  1:160, but no symptoms of rheumatologic disease, C3 and C4 normal #3 Episodic Facial Numbness + right-sided body numbness with fatigue - ? Hemi-migraines, trigeminal neuralgia - unrelated to IFNa and PV #4 LFT increases, transient  & now resolved-- seemingly related to Augmentin.   Has seen [facility] hepatology  Current Treatment = Pegylated interferon alfa 2a, 06/2017 - present. Initial dose was 45mcg subcutaneous weekly, reduced to every 2 weeks in 2021 due to LFT increases. Increased back to 45mcg subcutaneous every 7-10 days 11/2022 due to rising hematocrit -- LFTs have remained stable since that time.  Current Response = ELN - no response as still requiring intermittent therapeutic phlebotomies, however, the patient's target hematocrit is overall less than usual at <40% so it is actually unclear about hematologic response.  Did have hematocrit of 44.2% on 03/13/21.  Platelets and WBC are normal.    Overall, appears to have well-controlled PV with great tolerance of pegylated interferon alfa 2a (Pegasys) 45mcg subcutaneous every 10-14 days, with stable and predictable fatigue for 24-48 hours after each dose, but no further neutropenia nor LFT abnormalities.  Recall that last JAK2 variant allele frequency was 8% from peripheral blood on 06/15/2019.  Again discussed the possibility of repeating this assay in 2025.  Contacted the lab and was told the patient can find an estimate by Going under billing and select create estimates. Just needs the CPT code: 18549.  #5 Hypertension  New since [redacted] weeks gestation with second pregnancy (delivered 01/2022).  Taking nifedipine  ER 30mg  po daily.  This is currently well-controlled, but appears likely that she still requires antihypertensives given the blood pressure reading of 120s over 89 today while on treatment.  Therapeutic phlebotomy has not been helpful in reducing the blood pressure.There is some data to suggest that ACEi/ARBs may reduce cardiac events in MPN and  should be considered after breast feeding ceases [facility] Hematol. 2023 Aug 21;102(10):2717-2723.).  #6 Restless leg syndrome #7 Iron deficiency - intentional due to #1 Mild RLS symptoms managed with nocturnal magnesium  glycinate.  No indication to administer iron repletion, which is likely to drive up hematocrit and lead to more therapeutic phlebotomies, creating a cycle of iron repletion --> more phlebotomy --> iron repletion.  Discussed potential to use rusfertide to replace therapeutic phlebotomy in the future if/when FDA-approved.   #8 Episodes of ocular redness, blurred vision, mild pain - ? Ophthalmic zoster These episodes are not consistent with thrombotic events and do not seem related to interferon treatment.  Prior evaluation at [facility] suggested ophthalmic zoster which seems to fit with the symptoms and presentation.  I suggest she continue zoster-directed therapy for such events unless there is a clear diagnosis otherwise by Ophthalmology.   Plan  1. Continue Pegasys 45mg  subcutaneous every 14 days  Goals:  WBC normal, ANC >1.5  Hematocrit <40% ideally based on patient's symptom burden: (phlebotomy locally as been at [facility])  Platelets - <1,000,000/uL  Max dose = 180 mcg subcutaneous weekly.   Labs: CBC-D, CMP q 8-12 weeks. TSH q6 months.  Myeloid  NGS q12 months. (Any time now) 2. Continue aspirin 81mg  po twice daily.   Indication = secondary thromboprophylaxis for PV (h/o TIA) + microvascular symptoms 3. Follow-up in 6 months, sooner as-needed 4. If aquagenic pruritus worsens:  1) Zyrtec (cetirizine) 5-10mg  po daily + Pepcid (famotidine) 10-20mg  po daily 2) Beta alanine powder or capsules:  1g po 30 minutes before showering   Shannon Mcdonald, M.D. Associate Professor of Medicine Division of Hematology  Co-PI, [facility] MDS [facility] of Excellence Member, [facility] Member, [facility]  [address]  [address] [place] [address]  [email or web address]  Questions and appointments M-F 8am - 5pm: [phone] or [phone]  Myeloproliferative Neoplasms & Bone Marrow Failure Clinical Team Nurse Navigator: Delon Bring New Patient Intake Coordinator: Peyton Morgan & Charleen Shams Return Patient Scheduler: [facility] Leukemia Oncology Return Scheduling  Nurse Practitioner: Asberry Adjutant, AGNP Clinical Pharmacist: Lindajean Door, CPP  [email or web address]   I spent 45 minutes on this encounter, including pre- and post-visit activities.        History of Present Illness   Hematology/Oncology History  Polycythemia vera     01/15/2014 Initial Diagnosis   Polycythemia vera  Dx =  polycythemia vera, 2015.  At the age of 43yo, was noted on routine well-check to have a platelet count of 520,000/uL. Subsequently that year, she went hiking in [place] and ended up with shortness of breath prompting evaluation for PE (never found).   Going back, the patient had elevated platelet counts to 2006.  In 2015, she was referred to Hematology in [place] where JAK2 V617F at VAF 9.7% was found from peripheral blood. Spleen measured 13.3 x 7.8 x 7cm on abdominal ultrasound 12/08/2013.     Bmbx = (Outside case: [phone], dated 01/15/2014) Bone marrow, aspiration and biopsy -  Normocellular bone marrow (50%) with trilineage hematopoiesis with occasional morphologically atypical megakaryocytes (See Comment) -  By outside report, flow cytometric analysis failed to identify any abnormal hematolymphoid cell populations; see details in scanned documents.  -  By outside report, cytogenetic analysis revealed a normal female karyotype; see details in scanned documents.  At that time, CBC showed: WBC 6.3, Hgb 13, Hmct 38.5, MCV 92.6, Plts 537.  There was no stainable iron present on the biopsy.  Overall, Dr. Othella felt the diagnosis was most  compatible with masked polycythemia vera.   Driver Mutation = JAK2 V617F, VAF 6.2% @ diagnosis from peripheral blood ([facility]/05/05/2019) Additional Mutations = None (06/2019), from peripheral blood.    Karyotype = 71, XX [20]   MPN-associated symptoms: (+) fatigue (+)intermittent n/t of right face, right upper extremity, headaches - improved with aspirin.  Had CT head 09/28/2013 negative.  (+) erythromelalgia   Thrombosis History: Patient had shortness of breath, chest pain, headache, following a 5-day backpacking trip in [place].  D-dimers were elevated, but CT-angiogram 09/03/2013 negative.  TIA (?) 0/09/2013 with normal CT head 09/28/2013.  Pt experienced numbness of the right face, hand and loss of some motor control.    Treatment History/Disease Course: A. Aspirin 81mg  po daily initially, increased to aspirin 81mg  po BID 06/2019 given ongoing erythromelalgia symptoms on once-daily aspirin.  Twice daily aspirin improved the symptoms.  B. Pegylated interferon alpha 2a - 06/14/2017 + intermittent therapeutic phlebtomy - was started pre-pregnancy.  [facility] Established care @ [facility] MPN [facility] 06/15/2019  PREGNANCY HISTORY: First pregnancy:  Delivered 09/25/2018 by C-section @ [facility].  Was receiving pegylated interferon  alfa 2a + therapeutic phlebotomy + aspirin 81mg  po daily.  No issues with IUGR nor pre-eclampsia.  Was under the care of Dr. Sheria Agent at [facility].  Indication for C-section was that after induction of labor, fetus intolerant of any additional augmentation [of labor].  Decision made to proceed with urgen Cesarean section.  No post-partum hemorrhage. Received post-partum enoxaparin 40mcg subcutaneous daily x  weeks without difficulty.    Second pregnancy: Maternal age = 43yo.  Miscarried at ~[redacted] weeks gestation.    Third pregnancy: 05/2021.  Currently ~7.[redacted] weeks gestation.  Experiencing nausea & fatigue.  Has first ultrasound today.  Still deciding where she will deliver.  Likely  will stick with MFM Dr. Jacquline @ [facility].  Remains on long-term pegylated interferon alfa 2a 45mcg subcutaneous every 2 weeks (recall, at frequency had LFT abnormalities).  Remains on aspirin     06/14/2017 -  Chemotherapy   PEGYLATED INTERFERON ALFA 2A (PEGASYS) Indication for treatment = Pre-pregnancy + prior concern for thrombosis + poor tolerance of iron deficiency induced by therapeutic phlebotomy  Treatment Targets = Complete hematologic response. *Note hematocrit goal is  <40% due to symptoms at hematocrits higher than this (aching in chest and dull headache) -->  subcutaneous once weekly. 06/15/2019: WBC 3.8. ANC 2.0. Hgb 14.2. Hmct 44.8. Plts 265. Normal LFTs.  --> therapeutic phlebotomy ( ) --> JAK2 V617F VAF 8% from peripheral blood @ [facility] 09/17/2019: WBC 3.1. ANC 1.3. Hgb 13.0. Hmct 38.5. Plts 167. AST 76.  12/03/2019: WBC 3.1. ANC 1.8. Hgb 14.0. Hmct 41.6. Plts 218.  AST 38. ALT 93. Alk Phos 54. T bili 0.9.  --> 12/07/2019: therapeutic phlebotomy --> 03/11/2020: therapeutic phlebotomy 04/01/2020: WBC 4.1. ANC 2.3. Hgb 13.4. Hmct 40.5. Plts 236. AST 50. ALT 126. Alk phos 59. T bili 0.6.  04/25/2020: WBC 3.6. ANC 1.9. Hgb 13.3. Hmct 38.1. Plts 185. AST 113. ALT 300. Alk phos 62. T bli 0.7.  --> Pegasys ON HOLD due to increasing LFTs 05/09/2020: WBC 4.7. Hgb 13.4. Hmct 39.8. Plts 209. AST 144. ALT 450. Alk Phos 71. T bili 0.5.  --> Saw Dr. Donna Magnuson, [facility] Hepatology.  Liver ultrasound was unrevealing.  Noted differential diagnosis was DILI (COVID booster, Augmentin), AIH (+ANA and postpartum) - thought ok to continue interferon.  Labs at that time: ANA 1:160. Mitochondrial antibodies NEGATIVE, Anti-smooth muscle antibodies NEGATIVE.  Hepatitis B non-immune and non-infected; Hep C non-reactive, Hep A IgG positive.  Planned follow-up in 3 months (08/2020). --> 06/09/20: therapeutic phlebotomy  06/16/2020: WBC 6.9. ANC 4.6. hgb 13.1. Hmct 37.1. Plts 289. AST 46. ALT  135. Alk phos 48. T bili 0.6.  4/22/202: WBC 4.1. ANC 2.2. Hgb 14.1 Hmct 40.8. Plts 308. AST 47. ALT 106. Alk Phos 43. T bili 0.5.  07/08/20: WBC 6.3, ANC 4.3, Hgb 13.9, Hmct 41.2, plts 302 07/09/20: Restarted Pegasys 45mcg every 2 weeks 07/15/20: Therapeutic Phlebotomy  10/04/20: WBC 4.7, ANC 2.8, Hgb 14.0, Hmct 42.1, plts 236 10/07/20: Therapeutic Phlebotomy 12/12/20: WBC 5.1, ANC 2.9, Hgb 13.3, Hct 38.9, plts 286, LFTs WNL 01/05/21: WBC 3.1, ANC 1.3. Hgb 13.0, Hmct 38.1, plts 247.   --> TP of 03/17/21: Hgb 14.8, Hmct 44.2.   --> TP of 06/19/21: WBC 3.5, ANC 1.7, Hgb 13.3, Hmct 38.2, MCV 91, Plts 195.   AST 20, AL 25, Alk phos 39, T bili 0.6.  --> Continue Pegasys 45mcg subcutaneous every 2 weeks and aspirin 81mg  po BID.  Still with intermittent right-sided facial  tingling, was recently trialed on gabapentin which may have mildly improved symptoms, ? If post-herpetic in nature and being evaluated by ENT at [facility].  --> 7.[redacted] weeks pregnant.  She saw [facility]y [facility], [facility]) on 10/05/21 in initial consultation for the right-sided ear pulsation.  --> Delivered a healthy baby Mannie) by C-section at [facility] [place] on 01/23/2022 Prior to delivery was on Pegasys 45mcg every 3 weeks. On 02/05/22, increased Pegasys back to 45mcg every 2 weeks.     Since then, LFTs are worsening: ALT:  <7 --> 58 --> 79 --> 158.   AST 70. Alk phos 116 (normal).  T bili 0.4.  CBC is normal.    This also happened in January/March 2022 with ALT peaking at 450 and subsequently normalizing. At that time, she was taking pegylated interferon alfa 2a (Pegasys) 45mcg weekly which she was tolerating to that time with minimally increased LFTs.     04/04/22:  Holding Pegasys due to LFT increases  05/03/2022: WBC 6.2, Hgb 13.5, Hmct 40.1, Plts 309.  ALT 40, AST 22, sCR 0.89. Liver ultrasound with Doppler -- negative/normal.     11/14/22: left-sided aching chest pain. Went to [facility] ED.  ACS ruled out.  Low  risk for PE by Wells criteria.  Hmct 40.2%.  --> increased Pegasys from 45mcg subcutaneous q14 days to 45mcg subcutaneous q7 days  02/25/23:   WBC 3.5, Hgb 13, Hmct 39, MCV 94, Plts 188  07/15/23:  WBC 3.1, ANC 1.9, Hgb 12.8, Hmct 40.7, Plts 185.   07/17/23:  Therapeutic Phlebotomy at [facility].  --> Changed Pegasys to 45mcg subcutaneous 10 days from 14 days.   08/12/23: WBC 5.1, Hgb 11.5, Hmct 36.5, Plts 245  09/16/23: WBC 3.5, Hgb 11.6, Hmct 36.3, Plts 199.  Normal sCR, normal LFTs.  *Note: last molecular testing was 05/2019 -- should update.         Medications   Current Outpatient Medications  Medication Sig Dispense Refill  . aspirin (ECOTRIN) 81 MG tablet Take 1 tablet (81 mg total) by mouth two (2) times a day.    . dilTIAZem (CARDIZEM SR) 60 MG 12 hr capsule Take 1 capsule (60 mg total) by mouth two (2) times a day.    . docosahexaenoic acid (DHA ALGAL-900 ORAL) Take 830 mg by mouth daily.    . MAGNESIUM  GLYCINATE ORAL Take 350 mg by mouth nightly. Muscle cramps/restless legs    . peginterferon alfa-2a (PEGASYS) 180 mcg/mL injection Inject 0.25 mL (45 mcg total) under the skin every seven (7) days. 4 mL 11  . prenatal vit 91-iron-folic-dha 28 mg iron- 975 mcg-200 mg Cmpk Take 1 tablet by mouth daily. Brand: vitamin code    . valACYclovir (VALTREX) 500 MG tablet Take 1 tablet (500 mg total) by mouth as needed.     No current facility-administered medications for this visit.     Allergies  No Known Allergies  Past Medical and Surgical History  Polycythemia vera Hypertension Intermittent LFT increases  Tonsillectomy + adenooidectomy Sinus surgery   Social History   Social History   Socioeconomic History  . Marital status: Married  Tobacco Use  . Smoking status: Never  . Smokeless tobacco: Never  Substance and Sexual Activity  . Alcohol use: Yes    Comment: occasionally  . Drug use: Never   Social Drivers of Corporate investment banker Strain: Low Risk   (09/27/2022)   Received from [facility]   Overall Financial Resource Strain (CARDIA)   .  Difficulty of Paying Living Expenses: Not hard at all  Food Insecurity: No Food Insecurity (09/27/2022)   Received from [facility]   Hunger Vital Sign   . Within the past 12 months, you worried that your food would run out before you got the money to buy more.: Never true   . Within the past 12 months, the food you bought just didn't last and you didn't have money to get more.: Never true  Transportation Needs: No Transportation Needs (09/27/2022)   Received from [facility] - Transportation   . Lack of Transportation (Medical): No   . Lack of Transportation (Non-Medical): No  Physical Activity: Insufficiently Active (09/27/2022)   Received from [facility]   Exercise Vital Sign   . On average, how many days per week do you engage in moderate to strenuous exercise (like a brisk walk)?: 4 days   . On average, how many minutes do you engage in exercise at this level?: 30 min  Stress: No Stress Concern Present (09/27/2022)   Received from [facility] of Occupational [facility] - Occupational Stress Questionnaire   . Feeling of Stress : Not at all  Social Connections: Moderately Integrated (09/27/2022)   Received from [facility]   Social Network   . How would you rate your social network (family, work, friends)?: Adequate participation with social networks  Housing: Low Risk  (09/27/2022)   Received from [facility] Stability Vital Sign   . Unable to Pay for Housing in the Last Year: No   . Number of Places Lived in the Last Year: 1   . Unstable Housing in the Last Year: No  Non-smoker.  MBA in buisness.  Originally from [place].   Enjoys traveling, hiking.   Family History  Paternal grandmother - in her 5's had an abdominal vein thrombosis - possibly had MPN.   1 brother - healthy Son - eczema and food allergies. Guillermina 01/2022 -  daugher Dickey born    09/20/23 - having a Toy [employer]-themed birthday party for [employer]

## 2023-10-29 ENCOUNTER — Emergency Department (HOSPITAL_COMMUNITY)

## 2023-10-29 ENCOUNTER — Other Ambulatory Visit: Payer: Self-pay

## 2023-10-29 ENCOUNTER — Encounter (HOSPITAL_COMMUNITY): Payer: Self-pay

## 2023-10-29 ENCOUNTER — Emergency Department (HOSPITAL_COMMUNITY)
Admission: EM | Admit: 2023-10-29 | Discharge: 2023-10-29 | Disposition: A | Discharge status: Home / Self Care | Attending: Emergency Medicine | Admitting: Emergency Medicine

## 2023-10-29 DIAGNOSIS — R519 Headache, unspecified: Secondary | ICD-10-CM | POA: Diagnosis present

## 2023-10-29 DIAGNOSIS — R202 Paresthesia of skin: Secondary | ICD-10-CM | POA: Insufficient documentation

## 2023-10-29 DIAGNOSIS — R5383 Other fatigue: Secondary | ICD-10-CM | POA: Insufficient documentation

## 2023-10-29 DIAGNOSIS — Z7982 Long term (current) use of aspirin: Secondary | ICD-10-CM | POA: Diagnosis not present

## 2023-10-29 LAB — CBC WITH DIFFERENTIAL/PLATELET
Abs Immature Granulocytes: 0 K/uL (ref 0.00–0.07)
Basophils Absolute: 0 K/uL (ref 0.0–0.1)
Basophils Relative: 1 %
Eosinophils Absolute: 0 K/uL (ref 0.0–0.5)
Eosinophils Relative: 0 %
HCT: 37 % (ref 36.0–46.0)
Hemoglobin: 12.2 g/dL (ref 12.0–15.0)
Immature Granulocytes: 0 %
Lymphocytes Relative: 44 %
Lymphs Abs: 1.6 K/uL (ref 0.7–4.0)
MCH: 28.6 pg (ref 26.0–34.0)
MCHC: 33 g/dL (ref 30.0–36.0)
MCV: 86.9 fL (ref 80.0–100.0)
Monocytes Absolute: 0.2 K/uL (ref 0.1–1.0)
Monocytes Relative: 6 %
Neutro Abs: 1.7 K/uL (ref 1.7–7.7)
Neutrophils Relative %: 49 %
Platelets: 229 K/uL (ref 150–400)
RBC: 4.26 MIL/uL (ref 3.87–5.11)
RDW: 15.3 % (ref 11.5–15.5)
WBC: 3.5 K/uL — ABNORMAL LOW (ref 4.0–10.5)
nRBC: 0 % (ref 0.0–0.2)

## 2023-10-29 LAB — COMPREHENSIVE METABOLIC PANEL WITH GFR
ALT: 38 U/L (ref 0–44)
AST: 24 U/L (ref 15–41)
Albumin: 3.9 g/dL (ref 3.5–5.0)
Alkaline Phosphatase: 41 U/L (ref 38–126)
Anion gap: 12 (ref 5–15)
BUN: 16 mg/dL (ref 6–20)
CO2: 22 mmol/L (ref 22–32)
Calcium: 8.8 mg/dL — ABNORMAL LOW (ref 8.9–10.3)
Chloride: 102 mmol/L (ref 98–111)
Creatinine, Ser: 0.85 mg/dL (ref 0.44–1.00)
GFR, Estimated: >60 mL/min (ref >60)
Glucose, Bld: 92 mg/dL (ref 70–99)
Potassium: 3.9 mmol/L (ref 3.5–5.1)
Sodium: 136 mmol/L (ref 135–145)
Total Bilirubin: 0.5 mg/dL (ref 0.0–1.2)
Total Protein: 7.6 g/dL (ref 6.5–8.1)

## 2023-10-29 LAB — PREGNANCY, URINE: Preg Test, Ur: NEGATIVE

## 2023-10-29 MED ORDER — LACTATED RINGERS IV BOLUS: 1000.0000 mL | Freq: Once | INTRAVENOUS | Status: DC

## 2023-10-29 MED ORDER — DIPHENHYDRAMINE HCL 25 MG PO CAPS
25.0000 mg | ORAL_CAPSULE | Freq: Once | ORAL | Status: AC
  Administered 2023-10-29: 25 mg via ORAL
  Filled 2023-10-29: qty 1

## 2023-10-29 MED ORDER — PROCHLORPERAZINE MALEATE 5 MG PO TABS
10.0000 mg | ORAL_TABLET | Freq: Once | ORAL | Status: AC
  Administered 2023-10-29: 10 mg via ORAL
  Filled 2023-10-29: qty 2

## 2023-10-29 MED ORDER — DIPHENHYDRAMINE HCL 50 MG/ML IJ SOLN: 12.5000 mg | Freq: Once | INTRAMUSCULAR | Status: DC

## 2023-10-29 MED ORDER — IBUPROFEN 400 MG PO TABS
600.0000 mg | ORAL_TABLET | Freq: Once | ORAL | Status: AC
  Administered 2023-10-29: 600 mg via ORAL
  Filled 2023-10-29: qty 1

## 2023-10-29 MED ORDER — PROCHLORPERAZINE EDISYLATE 10 MG/2ML IJ SOLN: 10.0000 mg | Freq: Once | INTRAMUSCULAR | Status: DC

## 2023-10-29 MED ORDER — KETOROLAC TROMETHAMINE 15 MG/ML IJ SOLN: 15.0000 mg | Freq: Once | INTRAMUSCULAR | Status: DC

## 2023-10-29 NOTE — Discharge Instructions (Signed)
 Your test results today are reassuring.  Follow-up with your outpatient neurology team.  Return to the ED for any new or worsening symptoms of concern.

## 2023-10-29 NOTE — ED Provider Triage Note (Signed)
 Emergency Medicine Provider Triage Evaluation Note  Jessly Merryl Buckels , a 43 y.o. female  was evaluated in triage.  Pt complains of facial tingling. Facial tingling x 4 days, also tingling sensation to hands and feet, and some discomfort to LUQ.  Has hx of hemi-migraines/trigeminal neuralgia, +ANA, ophthalmic zoster, and polycythemia vera diagnosed in 2015   Review of Systems  Positive: As above Negative: As above  Physical Exam  BP (!) 131/90   Pulse 70   Temp (!) 97.5 F (36.4 C)   Resp 18   Ht 5' 5 (1.651 m)   Wt 70.3 kg   SpO2 98%   BMI 25.79 kg/m  Gen:   Awake, no distress   Resp:  Normal effort  MSK:   Moves extremities without difficulty  Other:    Medical Decision Making  Medically screening exam initiated at 1:50 PM.  Appropriate orders placed.  Keshona Keiko Myricks was informed that the remainder of the evaluation will be completed by another provider, this initial triage assessment does not replace that evaluation, and the importance of remaining in the ED until their evaluation is complete.     Nivia Colon, PA-C 10/29/23 1354

## 2023-10-29 NOTE — ED Provider Notes (Signed)
 [place] EMERGENCY DEPARTMENT AT [facility] Provider Note   CSN: [id number] Arrival date & time: 10/29/23  1328     Patient presents with: facial tingling   Shannon Mcdonald is a 43 y.o. female.   HPI Patient presents for headache and paresthesias.  Medical history includes GERD, polycythemia vera, thrombocytosis, TIA.  She is followed by [facility] hematology.  She is on Pegasys and daily aspirin.  She has episodic facial numbness, right sided body numbness, fatigue.  Interpretation in the past has been possible having migraines versus trigeminal neuralgia.  Since yesterday, patient has had intermittent headache in addition to tingling in bilateral hands and feet as well as tingling in the right cheek area.  Severity of headache waxes and wanes.  Paresthesias are intermittent.  She does not feel the paresthesias are related to severity of her headache.  She describes her headache as in the vertex of scalp.  Symptoms are similar to prior episodes but more severe.  She took some Tylenol  this morning.  Current headache is mild.  She has some mild paresthesias to the right side of her face but denies any other areas of tingling at this time.    Prior to Admission medications   Medication Sig Start Date End Date Taking? Authorizing Provider  aspirin EC 81 MG tablet Take 162 mg by mouth 2 (two) times daily.    [provider]  diltiazem (CARDIZEM SR) 60 MG 12 hr capsule Take by mouth. 05/09/22   [provider]  Docosahexaenoic Acid (DHA PO) Take by mouth. 830 mg daily    [provider]  magnesium  oxide (MAG-OX) 400 MG tablet Take by mouth.    [provider]  peginterferon alfa-2a (PEGASYS) 180 MCG/ML injection Inject 180 mcg into the skin every 7 (seven) days. Per patient taking 45 mg every 14 days    [provider]  Prenatal MV-Min-Fe Fum-FA-DHA (PRENATAL+DHA) 28-0.975 & 200 MG MISC Take 1 tablet by mouth daily.    [provider]     Allergies: Patient has no known allergies.    Review of Systems  Neurological:  Positive for headaches.  All other systems reviewed and are negative.   Updated Vital Signs BP 112/82 (BP Location: Right Arm)   Pulse 81   Temp 98.1 F (36.7 C) (Oral)   Resp 18   Ht 5' 5 (1.651 m)   Wt 70.3 kg   SpO2 98%   BMI 25.79 kg/m   Physical Exam Vitals and nursing note reviewed.  Constitutional:      General: She is not in acute distress.    Appearance: Normal appearance. She is well-developed. She is not ill-appearing, toxic-appearing or diaphoretic.  HENT:     Head: Normocephalic and atraumatic.     Right Ear: External ear normal.     Left Ear: External ear normal.     Nose: Nose normal.     Mouth/Throat:     Mouth: Mucous membranes are moist.  Eyes:     Extraocular Movements: Extraocular movements intact.     Conjunctiva/sclera: Conjunctivae normal.  Cardiovascular:     Rate and Rhythm: Normal rate and regular rhythm.  Pulmonary:     Effort: Pulmonary effort is normal. No respiratory distress.  Abdominal:     General: There is no distension.     Palpations: Abdomen is soft.     Tenderness: There is no abdominal tenderness.  Musculoskeletal:        General: No swelling.  Normal range of motion.     Cervical back: Normal range of motion and neck supple.  Skin:    General: Skin is warm and dry.     Coloration: Skin is not jaundiced or pale.  Neurological:     General: No focal deficit present.     Mental Status: She is alert and oriented to person, place, and time.     Cranial Nerves: No cranial nerve deficit.     Sensory: No sensory deficit.     Motor: No weakness.     Coordination: Coordination normal.  Psychiatric:        Mood and Affect: Mood normal.        Behavior: Behavior normal.     (all labs ordered are listed, but only abnormal results are displayed) Labs Reviewed  COMPREHENSIVE METABOLIC PANEL WITH GFR - Abnormal; Notable for the following  components:      Result Value   Calcium 8.8 (*)    All other components within normal limits  CBC WITH DIFFERENTIAL/PLATELET - Abnormal; Notable for the following components:   WBC 3.5 (*)    All other components within normal limits  PREGNANCY, URINE    EKG: None  Radiology: CT Head Wo Contrast Result Date: 10/29/2023 CLINICAL DATA:  Headache. EXAM: CT HEAD WITHOUT CONTRAST TECHNIQUE: Contiguous axial images were obtained from the base of the skull through the vertex without intravenous contrast. RADIATION DOSE REDUCTION: This exam was performed according to the departmental dose-optimization program which includes automated exposure control, adjustment of the mA and/or kV according to patient size and/or use of iterative reconstruction technique. COMPARISON:  September 28, 2013 FINDINGS: Brain: No evidence of acute infarction, hemorrhage, hydrocephalus, extra-axial collection or mass lesion/mass effect. Vascular: No hyperdense vessel or unexpected calcification. Skull: Normal. Negative for fracture or focal lesion. Sinuses/Orbits: No acute finding. Other: None. IMPRESSION: No acute intracranial pathology. Electronically Signed   By: Suzen Dials M.D.   On: 10/29/2023 20:39     Procedures   Medications Ordered in the ED  prochlorperazine  (COMPAZINE ) tablet 10 mg (10 mg Oral Given 10/29/23 1956)  diphenhydrAMINE  (BENADRYL ) capsule 25 mg (25 mg Oral Given 10/29/23 1957)  ibuprofen  (ADVIL ) tablet 600 mg (600 mg Oral Given 10/29/23 1956)                                    Medical Decision Making Amount and/or Complexity of Data Reviewed Radiology: ordered.  Risk Prescription drug management.   This patient presents to the ED for concern of headache and paresthesias, this involves an extensive number of treatment options, and is a complaint that carries with it a high risk of complications and morbidity.  The differential diagnosis includes migraine headache, CVA, ICH, neoplasm,  neuropathy   Co morbidities / Chronic conditions that complicate the patient evaluation  GERD, polycythemia vera, thrombocytosis, TIA   Additional history obtained:  Additional history obtained from EMR External records from outside source obtained and reviewed including N/A   Lab Tests:  I Ordered, and personally interpreted labs.  The pertinent results include: Normal hemoglobin, mild leukopenia, normal kidney function, normal electrolytes   Imaging Studies ordered:  I ordered imaging studies including CT head I independently visualized and interpreted imaging which showed no acute findings I agree with the radiologist interpretation   Cardiac Monitoring: / EKG:  The patient was maintained on a cardiac monitor.  I personally viewed and interpreted  the cardiac monitored which showed an underlying rhythm of: Sinus rhythm   Problem List / ED Course / Critical interventions / Medication management  Patient presents for headache and intermittent paresthesias since yesterday.  On arrival in the ED, vital signs are normal.  Although she has a history of polycythemia vera and thrombocytosis, she has normal blood counts on CBC today.  She is well-appearing on exam.  She does not have any focal neurologic deficits.  She describes a current mild headache and mild right facial paresthesias.  Given the similarity of her symptoms to prior episodes, we will treat with migraine cocktail.  IV medications and fluids were initially ordered.  Patient refused IV.  Will treat with oral medications.  CT of head was ordered.  After headache cocktail, patient's headache improved.  She endorsed some ongoing mild right facial paresthesias.  CT imaging did not show any acute findings.  Patient to follow-up with her outpatient neurologist.  She was discharged in stable condition. I ordered medication including Compazine , Benadryl , Advil  for headache Reevaluation of the patient after these medicines showed  that the patient improved I have reviewed the patients home medicines and have made adjustments as needed  Social Determinants of Health:  Lives independently     Final diagnoses:  Acute nonintractable headache, unspecified headache type  Paresthesias    ED Discharge Orders     None          Melvenia Motto, MD 10/29/23 2131

## 2023-10-29 NOTE — ED Triage Notes (Signed)
 C/O headache, right sided facial tingling. C/O tingling in bilateral hands and feet. Denies blurred vision. Axox4. C/O overall weakness.

## 2023-10-29 NOTE — ED Notes (Signed)
 Patient transported to CT
